# Patient Record
Sex: Female | Born: 1965 | Race: White | Hispanic: No | Marital: Married | Smoking: Former smoker
Health system: Southern US, Community
[De-identification: ages and names within clinical notes are randomized; demographics above are authoritative.]

## PROBLEM LIST (undated history)

## (undated) DIAGNOSIS — Z87891 Personal history of nicotine dependence: Secondary | ICD-10-CM

## (undated) HISTORY — DX: Personal history of nicotine dependence: Z87.891

---

## 2000-05-21 ENCOUNTER — Ambulatory Visit (HOSPITAL_COMMUNITY): Admission: RE | Admit: 2000-05-21 | Discharge: 2000-05-21 | Payer: Self-pay | Admitting: Obstetrics and Gynecology

## 2000-06-16 ENCOUNTER — Other Ambulatory Visit: Admission: RE | Admit: 2000-06-16 | Discharge: 2000-06-16 | Payer: Self-pay | Admitting: Obstetrics and Gynecology

## 2001-01-03 ENCOUNTER — Inpatient Hospital Stay (HOSPITAL_COMMUNITY): Admission: AD | Admit: 2001-01-03 | Discharge: 2001-01-06 | Payer: Self-pay | Admitting: Obstetrics and Gynecology

## 2001-01-07 ENCOUNTER — Encounter: Admission: RE | Admit: 2001-01-07 | Discharge: 2001-02-06 | Payer: Self-pay | Admitting: Obstetrics and Gynecology

## 2001-06-17 ENCOUNTER — Other Ambulatory Visit: Admission: RE | Admit: 2001-06-17 | Discharge: 2001-06-17 | Payer: Self-pay | Admitting: Obstetrics and Gynecology

## 2004-08-07 ENCOUNTER — Other Ambulatory Visit: Admission: RE | Admit: 2004-08-07 | Discharge: 2004-08-07 | Payer: Self-pay | Admitting: Obstetrics and Gynecology

## 2005-02-12 ENCOUNTER — Inpatient Hospital Stay (HOSPITAL_COMMUNITY): Admission: AD | Admit: 2005-02-12 | Discharge: 2005-02-16 | Payer: Self-pay | Admitting: Obstetrics and Gynecology

## 2005-02-13 ENCOUNTER — Encounter (INDEPENDENT_AMBULATORY_CARE_PROVIDER_SITE_OTHER): Payer: Self-pay | Admitting: *Deleted

## 2005-04-25 ENCOUNTER — Observation Stay (HOSPITAL_COMMUNITY): Admission: AD | Admit: 2005-04-25 | Discharge: 2005-04-26 | Payer: Self-pay | Admitting: Obstetrics and Gynecology

## 2005-10-03 ENCOUNTER — Inpatient Hospital Stay (HOSPITAL_COMMUNITY): Admission: AD | Admit: 2005-10-03 | Discharge: 2005-10-06 | Payer: Self-pay | Admitting: Obstetrics and Gynecology

## 2005-11-15 ENCOUNTER — Other Ambulatory Visit: Admission: RE | Admit: 2005-11-15 | Discharge: 2005-11-15 | Payer: Self-pay | Admitting: Obstetrics and Gynecology

## 2009-05-13 HISTORY — PX: CHOLECYSTECTOMY: SHX55

## 2009-05-19 ENCOUNTER — Ambulatory Visit: Payer: Self-pay | Admitting: Gastroenterology

## 2009-05-19 DIAGNOSIS — K8020 Calculus of gallbladder without cholecystitis without obstruction: Secondary | ICD-10-CM | POA: Insufficient documentation

## 2009-05-22 LAB — CONVERTED CEMR LAB
ALT: 385 units/L — ABNORMAL HIGH (ref 0–35)
AST: 110 units/L — ABNORMAL HIGH (ref 0–37)
Albumin: 3.8 g/dL (ref 3.5–5.2)
Alkaline Phosphatase: 151 units/L — ABNORMAL HIGH (ref 39–117)
Amylase: 35 units/L (ref 27–131)
Bilirubin, Direct: 0.4 mg/dL — ABNORMAL HIGH (ref 0.0–0.3)
Lipase: 20 units/L (ref 11.0–59.0)
Total Bilirubin: 1.3 mg/dL — ABNORMAL HIGH (ref 0.3–1.2)
Total Protein: 7.3 g/dL (ref 6.0–8.3)

## 2009-05-23 ENCOUNTER — Ambulatory Visit (HOSPITAL_COMMUNITY): Admission: RE | Admit: 2009-05-23 | Discharge: 2009-05-23 | Payer: Self-pay | Admitting: Gastroenterology

## 2009-05-23 ENCOUNTER — Telehealth: Payer: Self-pay | Admitting: Gastroenterology

## 2009-05-24 ENCOUNTER — Ambulatory Visit (HOSPITAL_COMMUNITY): Admission: RE | Admit: 2009-05-24 | Discharge: 2009-05-24 | Payer: Self-pay | Admitting: Gastroenterology

## 2009-05-24 ENCOUNTER — Ambulatory Visit: Payer: Self-pay | Admitting: Gastroenterology

## 2009-05-25 ENCOUNTER — Inpatient Hospital Stay (HOSPITAL_COMMUNITY): Admission: AD | Admit: 2009-05-25 | Discharge: 2009-05-28 | Payer: Self-pay | Admitting: Surgery

## 2009-05-25 ENCOUNTER — Encounter (INDEPENDENT_AMBULATORY_CARE_PROVIDER_SITE_OTHER): Payer: Self-pay | Admitting: Surgery

## 2009-05-25 ENCOUNTER — Encounter: Payer: Self-pay | Admitting: Internal Medicine

## 2009-06-14 ENCOUNTER — Encounter: Payer: Self-pay | Admitting: Internal Medicine

## 2009-06-23 ENCOUNTER — Ambulatory Visit: Payer: Self-pay | Admitting: Internal Medicine

## 2009-06-23 DIAGNOSIS — Z87891 Personal history of nicotine dependence: Secondary | ICD-10-CM | POA: Insufficient documentation

## 2009-07-25 LAB — HM MAMMOGRAPHY

## 2009-09-26 ENCOUNTER — Ambulatory Visit: Payer: Self-pay | Admitting: Internal Medicine

## 2009-09-26 DIAGNOSIS — R5383 Other fatigue: Secondary | ICD-10-CM

## 2009-09-26 DIAGNOSIS — R5381 Other malaise: Secondary | ICD-10-CM

## 2009-09-27 LAB — CONVERTED CEMR LAB
ALT: 19 units/L (ref 0–35)
AST: 19 units/L (ref 0–37)
Albumin: 4.2 g/dL (ref 3.5–5.2)
Alkaline Phosphatase: 67 units/L (ref 39–117)
BUN: 13 mg/dL (ref 6–23)
Basophils Absolute: 0 10*3/uL (ref 0.0–0.1)
Basophils Relative: 0.5 % (ref 0.0–3.0)
Bilirubin, Direct: 0.1 mg/dL (ref 0.0–0.3)
CO2: 29 meq/L (ref 19–32)
Calcium: 9.3 mg/dL (ref 8.4–10.5)
Chloride: 101 meq/L (ref 96–112)
Creatinine, Ser: 0.8 mg/dL (ref 0.4–1.2)
Eosinophils Absolute: 0.2 10*3/uL (ref 0.0–0.7)
Eosinophils Relative: 2 % (ref 0.0–5.0)
GFR calc non Af Amer: 79.6 mL/min (ref >60)
Glucose, Bld: 78 mg/dL (ref 70–99)
HCT: 40.7 % (ref 36.0–46.0)
Hemoglobin: 13.9 g/dL (ref 12.0–15.0)
Lymphocytes Relative: 35 % (ref 12.0–46.0)
Lymphs Abs: 2.7 10*3/uL (ref 0.7–4.0)
MCHC: 34.2 g/dL (ref 30.0–36.0)
MCV: 87.7 fL (ref 78.0–100.0)
Monocytes Absolute: 0.5 10*3/uL (ref 0.1–1.0)
Monocytes Relative: 6.4 % (ref 3.0–12.0)
Neutro Abs: 4.3 10*3/uL (ref 1.4–7.7)
Neutrophils Relative %: 56.1 % (ref 43.0–77.0)
Platelets: 241 10*3/uL (ref 150.0–400.0)
Potassium: 4.6 meq/L (ref 3.5–5.1)
RBC: 4.64 M/uL (ref 3.87–5.11)
RDW: 13 % (ref 11.5–14.6)
Sodium: 139 meq/L (ref 135–145)
TSH: 3.02 microintl units/mL (ref 0.35–5.50)
Total Bilirubin: 0.4 mg/dL (ref 0.3–1.2)
Total Protein: 7.3 g/dL (ref 6.0–8.3)
WBC: 7.6 10*3/uL (ref 4.5–10.5)

## 2010-06-10 LAB — CONVERTED CEMR LAB
ALT: 18 units/L (ref 0–35)
AST: 18 units/L (ref 0–37)
Albumin: 4.1 g/dL (ref 3.5–5.2)
Alkaline Phosphatase: 71 units/L (ref 39–117)
BUN: 13 mg/dL (ref 6–23)
Basophils Absolute: 0.1 10*3/uL (ref 0.0–0.1)
Basophils Relative: 2.7 % (ref 0.0–3.0)
Bilirubin Urine: NEGATIVE
Bilirubin, Direct: 0.1 mg/dL (ref 0.0–0.3)
CO2: 29 meq/L (ref 19–32)
Calcium: 9 mg/dL (ref 8.4–10.5)
Chloride: 102 meq/L (ref 96–112)
Cholesterol: 196 mg/dL (ref 0–200)
Creatinine, Ser: 0.8 mg/dL (ref 0.4–1.2)
Eosinophils Absolute: 0.2 10*3/uL (ref 0.0–0.7)
Eosinophils Relative: 4 % (ref 0.0–5.0)
GFR calc non Af Amer: 83.16 mL/min (ref >60)
Glucose, Bld: 89 mg/dL (ref 70–99)
HCT: 39.7 % (ref 36.0–46.0)
HDL: 47 mg/dL (ref >39.00)
Hemoglobin, Urine: NEGATIVE
Hemoglobin: 13.1 g/dL (ref 12.0–15.0)
Ketones, ur: NEGATIVE mg/dL
LDL Cholesterol: 114 mg/dL — ABNORMAL HIGH (ref 0–99)
Leukocytes, UA: NEGATIVE
Lymphocytes Relative: 37.4 % (ref 12.0–46.0)
Lymphs Abs: 2 10*3/uL (ref 0.7–4.0)
MCHC: 33 g/dL (ref 30.0–36.0)
MCV: 87.2 fL (ref 78.0–100.0)
Monocytes Absolute: 0.3 10*3/uL (ref 0.1–1.0)
Monocytes Relative: 5.4 % (ref 3.0–12.0)
Neutro Abs: 2.7 10*3/uL (ref 1.4–7.7)
Neutrophils Relative %: 50.5 % (ref 43.0–77.0)
Nitrite: NEGATIVE
Pap Smear: NEGATIVE
Platelets: 202 10*3/uL (ref 150.0–400.0)
Potassium: 3.8 meq/L (ref 3.5–5.1)
RBC: 4.55 M/uL (ref 3.87–5.11)
RDW: 12.9 % (ref 11.5–14.6)
Sodium: 138 meq/L (ref 135–145)
Specific Gravity, Urine: 1.025 (ref 1.000–1.030)
TSH: 3.07 microintl units/mL (ref 0.35–5.50)
Total Bilirubin: 0.4 mg/dL (ref 0.3–1.2)
Total CHOL/HDL Ratio: 4
Total Protein, Urine: NEGATIVE mg/dL
Total Protein: 6.9 g/dL (ref 6.0–8.3)
Triglycerides: 177 mg/dL — ABNORMAL HIGH (ref 0.0–149.0)
Urine Glucose: NEGATIVE mg/dL
Urobilinogen, UA: 0.2 (ref 0.0–1.0)
VLDL: 35.4 mg/dL (ref 0.0–40.0)
Vit D, 25-Hydroxy: 25 ng/mL — ABNORMAL LOW (ref 30–89)
WBC: 5.3 10*3/uL (ref 4.5–10.5)
pH: 5 (ref 5.0–8.0)

## 2010-06-12 NOTE — Progress Notes (Signed)
Summary: ERCP Scheduled  Phone Note Outgoing Call   Call placed by: Laureen Ochs LPN,  May 23, 2009 2:23 PM Call placed to: Patient Summary of Call: Pt. is scheduled for an ERCP at [facility] on 05-24-09 at 2pm. She will be admitted afterwards for a Lap.Choley by Dr.Newman on 05-25-09. All instructions reviewed w/pt. by phone. Pt. instructed to call back as needed.  Initial call taken by: Laureen Ochs LPN,  May 23, 2009 2:24 PM

## 2010-06-12 NOTE — Procedures (Signed)
Summary: ERCP  Patient: Jasmine Burgess Note: All result statuses are Final unless otherwise noted.  Tests: (1) ERCP (ERC)   ERC ERCP                  DONE     [facility]     [address] [place], [place]  [address]           ERCP PROCEDURE REPORT           PATIENT:  Jasmine Burgess, Jasmine Burgess  MR#:  [id number]     BIRTHDATE:  [birth year]  GENDER:  female           ENDOSCOPIST:  Barbette Hair. Arlyce Dice, MD     ASSISTANT:           PROCEDURE DATE:  05/24/2009     PROCEDURE:  ERCP           INDICATIONS:           MEDICATIONS:   Fentanyl 100 mcg, Versed 8 mg IV, Benadryl 50 mg     IV, glycopyrrolate (Robinal) 0.2 mg     TOPICAL ANESTHETIC:  Cetacaine Spray           DESCRIPTION OF PROCEDURE:   After the risks benefits and     alternatives of the procedure were thoroughly explained, informed     consent was obtained.  The XB-[id number] ([id number]) endoscope was     introduced through the mouth and advanced to the third portion of     the duodenum.           Cannulation of the common bile duct was accomplished. The common     bile duct and intrahepatics were normal without filling defects,     strictures, or stones. CBD, CHD and intrahepatic ducts were     selectively filled with contrast after placement of a 0.19mm wire.     Duct was swept with a 12mm balloon stone extractor. No stones were     seen    The scope was then completely withdrawn from the patient     and the procedure terminated.           COMPLICATIONS:  None           ENDOSCOPIC IMPRESSION:Normal cholangiogram           Pt likely passed bile duct stones           RECOMMENDATIONS:     1) surgery           ______________________________     Barbette Hair. Arlyce Dice, MD           cc: Dr. Rene Paci     cc: Dr. Ovidio Kin           CC:           n.     eSIGNED:   Barbette Hair. Sena Clouatre at 05/24/2009 02:54 PM           Janina Mayo, [phone]  Note: An exclamation mark (!) indicates a result that was not dispersed into the  flowsheet. Document Creation Date: 05/24/2009 2:55 PM _______________________________________________________________________  (1) Order result status: Final Collection or observation date-time: 05/24/2009 14:47 Requested date-time:  Receipt date-time:  Reported date-time:  Referring Physician:   Ordering Physician: Melvia Heaps [phone]) Specimen Source:  Source: Launa Grill Order Number: [id number] Lab site:

## 2010-06-12 NOTE — Assessment & Plan Note (Signed)
Summary: NEW CPX/ [employer] / #/ CD   Vital Signs:  Patient profile:   45 year old female Height:      68.5 inches (173.99 cm) Weight:      197.0 pounds (89.55 kg) O2 Sat:      97 % on Room air Temp:     98.2 degrees F (36.78 degrees C) oral Pulse rate:   71 / minute BP sitting:   118 / 80  (left arm) Cuff size:   large  Vitals Entered By: Orlan Leavens (June 23, 2009 2:25 PM)  O2 Flow:  Room air CC: New patient cpx Is Patient Diabetic? No Pain Assessment Patient in pain? no         Primary Care Provider:  Rene Paci, MD  CC:  New patient cpx.  History of Present Illness: new pt to me and out divison - here to est care also patient is here today for annual physical. Patient feels well and has no complaints.   Preventive Screening-Counseling & Management  Alcohol-Tobacco     Alcohol drinks/day: <1     Alcohol Counseling: not indicated; use of alcohol is not excessive or problematic     Smoking Status: quit     Tobacco Counseling: not indicated; no tobacco use  Caffeine-Diet-Exercise     Does Patient Exercise: yes     Exercise Counseling: to improve exercise regimen     Depression Counseling: not indicated; screening negative for depression  Safety-Violence-Falls     Seat Belt Use: yes     Seat Belt Counseling: not applicable     Helmet Use: yes     Helmet Counseling: not indicated; patient wears helmet when riding bicycle/motocycle     Smoke Detectors: yes     Violence in the Home: no risk noted  Clinical Review Panels:  Prevention   Last Pap Smear:  Interpretation/Result:Negative for intraepithelial Lesion or Malignancy.    (05/14/2007)  Immunizations   Last Tetanus Booster:  Tdap (06/23/2009)   Last Flu Vaccine:  Historical (05/27/2009)   Current Medications (verified): 1)  Advil Pm 200-38 Mg Tabs (Ibuprofen-Diphenhydramine Cit) .... At Bedtime For Sleep 2)  Claritin 10 Mg Tabs (Loratadine) .... Use As Needed 3)  Multivitamins  Tabs (Multiple  Vitamin) .... 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 I have reviewed all other systems and they were negative.   Physical Exam  General:  alert, well-developed, well-nourished, and cooperative to examination.    Eyes:  vision grossly intact; pupils equal, round and reactive to light.  conjunctiva and lids normal.    Ears:  normal pinnae bilaterally, without erythema, swelling, or tenderness to palpation. TMs clear, without effusion, or cerumen impaction. Hearing grossly normal bilaterally  Mouth:  teeth and gums in good repair; mucous membranes moist, without  lesions or ulcers. oropharynx clear without exudate, no erythema.  Neck:  supple, full ROM, no masses, no thyromegaly; no thyroid nodules or tenderness. no JVD or carotid bruits.   Lungs:  normal respiratory effort, no intercostal retractions or use of accessory muscles; normal breath sounds bilaterally - no crackles and no wheezes.    Heart:  normal rate, regular rhythm, no murmur, and no rub. BLE without edema. normal DP pulses and normal cap refill in all 4 extremities    Abdomen:  soft, non-tender, normal bowel sounds, no distention; no masses and no appreciable hepatomegaly or splenomegaly.  well healed scar at umbilicus Genitalia:  defer to gyn Msk:  No deformity or scoliosis  noted of thoracic or lumbar spine.   Neurologic:  alert & oriented X3 and cranial nerves II-XII symetrically intact.  strength normal in all extremities, sensation intact to light touch, and gait normal. speech fluent without dysarthria or aphasia; follows commands with good comprehension.  Skin:  no rashes, vesicles, ulcers, or erythema. No nodules or irregularity to palpation.  Psych:  Oriented X3, memory intact for recent and remote, normally interactive, good eye contact, not anxious appearing, not depressed appearing, and not agitated.      Impression & Recommendations:  Problem # 1:  PREVENTIVE HEALTH CARE (ICD-V70.0) Patient has been counseled on age-appropriate routine health concerns for screening and prevention. These are reviewed and up-to-date. Immunizations are up-to-date or declined. Labs ordered and ECG reviewed.  Orders: EKG w/ Interpretation (93000) TLB-Lipid Panel (80061-LIPID) TLB-BMP (Basic Metabolic Panel-BMET) (80048-METABOL) TLB-CBC Platelet - w/Differential (85025-CBCD) TLB-Hepatic/Liver Function Pnl (80076-HEPATIC) TLB-TSH (Thyroid Stimulating Hormone) (84443-TSH) T-Vitamin D (25-Hydroxy) (13086-57846) TLB-Udip ONLY (81003-UDIP) Misc. Referral (Misc. Ref)  Complete Medication List: 1)  Advil Pm 200-38 Mg Tabs (Ibuprofen-diphenhydramine cit) .... At bedtime for sleep 2)  Claritin 10 Mg Tabs (Loratadine) .... Use as needed 3)  Multivitamins Tabs (Multiple vitamin) .... Once daily  Other Orders: Tdap => 57yrs IM (96295) Admin 1st Vaccine (28413)  Patient Instructions: 1)  it was good to see you today.  2)  test(s) ordered today - your results will be posted on the phone tree for review in 48-72 hours from the time of test completion; call [phone] and enter your 9 digit MRN (listed above on this page, just below your name); if any changes need to be made or there are abnormal results, you will be contacted directly.  3)  exam and EKG look good today! 4)   we'll make referral for mammogram. Our office will contact you regarding this appointment once made.  5)  Take calcium +Vitamin D daily. 6)  Please schedule a follow-up appointment annually for medical physical, sooner if problems.    Immunization History:  Influenza Immunization History:    Influenza:  historical (05/27/2009)  Immunizations Administered:  Tetanus Vaccine:    Vaccine Type: Tdap    Site: left deltoid    Mfr: GlaxoSmithKline    Dose: 0.5 ml    Route: IM    Given by: Orlan Leavens    Exp. Date: 07/08/2011    Lot #: [id number]    VIS given: 06/23/09    Pap Smear  Procedure date:  05/14/2007  Findings:      Interpretation/Result:Negative for intraepithelial Lesion or Malignancy.

## 2010-06-12 NOTE — Letter (Signed)
Summary: Generic Letter  [facility]  [address] [place], [place] [address]   Phone: [phone]  Fax: [phone]    05/19/2009  Jasmine Burgess [address] [place], [place]  [address]  Dear Ms. Fitzmaurice,  It is my pleasure to have treated you recently as a new patient in my office. I appreciate your confidence and the opportunity to participate in your care.  Since I do have a busy inpatient endoscopy schedule and office schedule, my office hours vary weekly. I am, however, available for emergency calls everyday through my office. If I am not available for an urgent office appointment, another one of our gastroenterologist will be able to assist you.  My well-trained staff are prepared to help you at all times. For emergencies after office hours, a physician from our Gastroenterology section is always available through my 24 hour answering service  Once again I welcome you as a new patient and I look forward to a happy and healthy relationship               Sincerely,   Melvia Heaps MD  Appended Document: Generic Letter mailed

## 2010-06-12 NOTE — Assessment & Plan Note (Signed)
Summary: abdominal pain/gallstones/lk   History of Present Illness Visit Type: Initial Visit Primary GI MD: Melvia Heaps MD [facility] Primary Provider: Rene Paci, MD Chief Complaint: abdominal pain History of Present Illness:   Jasmine Burgess is a pleasant 45 year old white female referred at the request of Dr.Leschber for evaluation of abdominal pain.  In 2006, while [redacted] weeks pregnant, she developed severe right upper quadrant pain requiring hospitalization.  Gallstones were seen on ultrasound.  Surgery was recommended following the end of her pregnancy but this was not done.  She subsequently has had very mild episodes of abdominal pain.  Over the past week she has had 3 discrete episodes of severe midepigastric pain with radiation to her back accompanied  by vomiting.  She has had no fever or chills.  She has noted darkening of her urine and questionable yellow sclera.  She currently is pain-free.   GI Review of Systems    Reports abdominal pain, bloating, nausea, and  vomiting.     Location of  Abdominal pain: RUQ.    Denies acid reflux, belching, chest pain, dysphagia with liquids, dysphagia with solids, heartburn, loss of appetite, vomiting blood, weight loss, and  weight gain.      Reports change in bowel habits.     Denies anal fissure, black tarry stools, constipation, diarrhea, diverticulosis, fecal incontinence, heme positive stool, hemorrhoids, irritable bowel syndrome, jaundice, light color stool, liver problems, rectal bleeding, and  rectal pain. Preventive Screening-Counseling & Management  Alcohol-Tobacco     Smoking Status: quit      Drug Use:  no.      Current Medications (verified): 1)  Advil Pm 200-38 Mg Tabs (Ibuprofen-Diphenhydramine Cit) .... 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  Alcohol Use - yes Daily Caffeine Use Illicit Drug Use - no Smoking Status:  quit Drug Use:  no  Review of Systems       The patient complains of allergy/sinus, back pain, and cough.         All other systems were reviewed and were negative   Vital Signs:  Patient profile:   45 year old female Height:      68.5 inches Weight:      205 pounds BMI:     30.83 Pulse rate:   60 / minute Pulse rhythm:   regular BP sitting:   106 / 70  (left arm) Cuff size:   regular  Vitals Entered By: June McMurray CMA Duncan Dull) (May 19, 2009 3:41 PM)  Physical Exam  Additional Exam:  She is a healthy-appearing female  skin: anicteric HEENT: normocephalic; PEERLA; no nasal or pharyngeal abnormalities neck: supple nodes: no cervical lymphadenopathy chest: clear to ausculatation and percussion heart: no murmurs, gallops, or rubs abd: soft, nontender; BS normoactive; no abdominal masses,  organomegaly, there is mild tenderness to palpation in the right upper quadrant rectal: deferred ext: no cynanosis, clubbing, edema skeletal: no deformities neuro: oriented x 3; no focal abnormalities    Impression & Recommendations:  Problem # 1:  CHOLELITHIASIS, SYMPTOMATIC (ICD-574.20)  The patient's symptoms are very likely biliary in origin.  Questionable jaundice and dark urine raises the question of bile duct obstruction from choledocholithiasis.  Recommendations #1check  LFTs and amylase #2 abdominal ultrasound #3 surgical referral  Orders: TLB-Hepatic/Liver Function Pnl (  80076-HEPATIC) TLB-Amylase (82150-AMYL) TLB-Lipase (83690-LIPASE) Prescriptions: PERCOCET 5-325 MG TABS (OXYCODONE-ACETAMINOPHEN) take 1-2 tabs q.6 h. p.r.n.  #20 x 0   Entered and Authorized by:   Louis Meckel MD   Signed by:   Louis Meckel MD on 05/19/2009   Method used:   Print then Give to Patient   RxID:   [id number]   Appended  Document: Orders Update    Clinical Lists Changes  Orders: Added new Test order of Ultrasound Abdomen (UAS) - Signed      Appended Document: Orders Update    Clinical Lists Changes  Orders: Added new Test order of [facility] ([facility]) - Signed

## 2010-06-12 NOTE — Letter (Signed)
Summary: [facility]   Imported By: Lester [place] 06/22/2009 08:02:33  _____________________________________________________________________  External Attachment:    Type:   Image     Comment:   External Document

## 2010-06-12 NOTE — Consult Note (Signed)
Summary: [facility] Consult   NAME:  Jasmine Burgess, Jasmine Burgess                  ACCOUNT NO.:  [id number]   MEDICAL RECORD NO.:  [id number]          PATIENT TYPE:  INP   LOCATION:  9310                          FACILITY:  [facility]   PHYSICIAN:  Angelia Mould. Derrell Lolling, M.D.DATE OF BIRTH:  [birth year]   DATE OF CONSULTATION:  02/13/2005  DATE OF DISCHARGE:                                   CONSULTATION   REASON FOR CONSULTATION:  Evaluate abdominal pain and gallstones.   HISTORY OF PRESENT ILLNESS:  This is a 45 year old white female who is [redacted]  weeks pregnant with her second pregnancy.  She presented with a 24 hour  history of epigastric and right upper quadrant pain and back pain and nausea  and vomiting.  The nausea and vomiting have resolved but she still has pain.  She was seen by Dr Dierdre Forth and was admitted on the evening of  February 12, 2005, and we were called to see her on February 13, 2005.  An  ultrasound has been performed.  This shows multiple gallstones, but the  gallbladder wall is not thickened.  There is no  pericholecystic fluid, and  the common bile duct is not dilated.   PAST MEDICAL HISTORY:  She has been healthy.  No medical or surgical  problems.   CURRENT MEDICATIONS:  None.   ALLERGIES:  None known.   SOCIAL HISTORY:  She is married, self employed, denies use of tobacco,  alcohol or street drugs.   FAMILY HISTORY:  Her mother and two sisters have had cholecystectomy.  Otherwise, there is no significant familial diseases.   REVIEW OF SYSTEMS:  A 15-system review of systems is performed.  It is  noncontributory, except as described above.   PHYSICAL EXAMINATION:  GENERAL APPEARANCE:  Pleasant, alert, white female in  minimal distress.  VITAL SIGNS:  Temperature 98.3, heart rate 57-74, blood pressure 111/69,  respirations 18 and unlabored.  Oxygen saturation 98%.  HEENT:  EYES:  Sclerae are clear.  Extraocular movements intact.  ENT:  Nose, lips, tongue and oropharynx without  __________.  NECK:  Supple, nontender, no mass.  No JVD.  LUNGS:  Clear to auscultation.  No chest wall tenderness.  HEART:  Regular rate and rhythm.  No murmur.  Rate on femoral and posterior  tibial pulses are palpable.  ABDOMEN:  Soft, nondistended.  Liver and spleen are not enlarged.  She  subjectively is tender in the epigastrium and right upper quadrant, but  there is no guarding, no mass, no rebound, no extension.  EXTREMITIES:  She moves all four extremities well without pain or deformity.  NEUROLOGICAL:  No gross motor or sensory deficits.   LABORATORY DATA:  Amylase 56, lipase 39, pregnancy test positive.  Comprehensive metabolic panel is normal except for glucose of 153.  CBC  shows white count of 13,600 and hemoglobin of 12.9.   ASSESSMENT:  1.  Gallstones with moderately severe biliary colic.  2.  Question of whether she may have a component of acute cholecystitis.  3.  Leukocytosis.  It is not clear whether  this is due to cholecystitis or      to the pregnancy.  4.  Eight weeks intrauterine pregnancy.   PLAN:  For the next 24 hours, I would keep her n.p.o. and keep her hydrated  without any fluids.  Analgesics and antiemetics have already been ordered.  I will add IV Ancef in case there is any component of bacterial invasion of  the gallbladder.  I am not sure this is the case, however.   We will try to avoid cholecystectomy during her pregnancy, and at the very  least, will try to avoid it during the first trimester.   We will follow along with you.      Angelia Mould. Derrell Lolling, M.D.  Electronically Signed     HMI/MEDQ  D:  02/14/2005  T:  02/14/2005  Job:  [id number]

## 2010-06-12 NOTE — Consult Note (Signed)
[facility]   Imported By: Lester McIntosh 06/10/2009 09:16:40  _____________________________________________________________________  External Attachment:    Type:   Image     Comment:   External Document

## 2010-06-12 NOTE — Assessment & Plan Note (Signed)
Summary: fatigue,no energy/cd   Vital Signs:  Patient profile:   45 year old female Height:      68.5 inches (173.99 cm) Weight:      204 pounds (92.73 kg) O2 Sat:      98 % on Room air Temp:     98.1 degrees F (36.72 degrees C) oral Pulse rate:   58 / minute BP sitting:   102 / 78  (left arm) Cuff size:   large  Vitals Entered By: Orlan Leavens (Sep 26, 2009 11:19 AM)  O2 Flow:  Room air CC: Fatigue/ No energy Is Patient Diabetic? No Pain Assessment Patient in pain? no        Primary Care Provider:  Rene Paci, MD  CC:  Fatigue/ No energy.  History of Present Illness:  Fatigue      This is a 45 year old woman who presents with Fatigue.  The symptoms began 4 weeks ago.  The severity is described as moderate.  occassional dizziness. +tick bite and exposure 08/2009 while camping but no rash.  The patient reports persistent fatigue and fatigue without physical limitations.  The patient denies fever, night sweats, weight loss, exertional chest pain, dyspnea, cough, hemoptysis, and new medications.  Other symptoms include daytime sleepiness.  The patient denies the following symptoms: leg swelling, orthopnea, melena, and skin changes.  Depressive symptoms include anhedonia.  The patient denies feeling depressed, altered appetite, and poor sleep.    Current Medications (verified): 1)  Advil Pm 200-38 Mg Tabs (Ibuprofen-Diphenhydramine Cit) .... At Bedtime For Sleep 2)  Claritin 10 Mg Tabs (Loratadine) .... Use As Needed 3)  Multivitamins  Tabs (Multiple Vitamin) .... Once Daily  Allergies (verified): No Known Drug Allergies  Past History:  Past Medical History: cholelithiasis hx overweight  MD rooster: GI - [facility] surg - [facility] - [place]  Family History: Reviewed history from 06/23/2009 and no changes required. No FH of Colon Cancer: mom - 73y - DM dx age 82, HTN, obese dad - 92s - A flutter s/p ablation  Social History: Reviewed history from 06/23/2009  and no changes required. 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    Physical Exam  General:  alert, well-developed, well-nourished, and cooperative to examination.    Eyes:  vision grossly intact; pupils equal, round and reactive to light.  conjunctiva and lids normal.    Ears:  normal pinnae bilaterally, without erythema, swelling, or tenderness to palpation. TMs clear, without effusion, or cerumen impaction. Hearing grossly normal bilaterally  Mouth:  teeth and gums in good repair; mucous membranes moist, without lesions or ulcers. oropharynx clear without exudate, no erythema.  Lungs:  normal respiratory effort, no intercostal retractions or use of accessory muscles; normal breath sounds bilaterally - no crackles and no wheezes.    Heart:  normal rate, regular rhythm, no murmur, and no rub. BLE without edema. Skin:  2 healing areas of insect bite - left lateral breast and left scapula - no cellulitis - no rashes, vesicles, ulcers, or erythema. No nodules or irregularity to palpation.  Psych:  Oriented X3, memory intact for recent and remote, normally interactive, good eye contact, not anxious  appearing, not depressed appearing, and not agitated.      Impression & Recommendations:  Problem # 1:  FATIGUE (ICD-780.79) hx and exam benign - no constellation of symptoms for dx of lyme or RMSF at this time - check labs now - no indicaton fo abx or other specific tx at this time consider tx for mild depression if labs unremarkable - pt to consider Orders: TLB-CBC Platelet - w/Differential (85025-CBCD) TLB-BMP (Basic Metabolic Panel-BMET)  (80048-METABOL) TLB-Hepatic/Liver Function Pnl (80076-HEPATIC) TLB-TSH (Thyroid Stimulating Hormone) (84443-TSH)  Complete Medication List: 1)  Advil Pm 200-38 Mg Tabs (Ibuprofen-diphenhydramine cit) .... At bedtime for sleep 2)  Claritin 10 Mg Tabs (Loratadine) .... Use as needed 3)  Multivitamins Tabs (Multiple vitamin) .... Once daily  Patient Instructions: 1)  it was good to see you today. 2)  exam looks normal today - try using 1% hydrocortisone cream to itch/bite as needed  3)  test(s) ordered today - your results will be posted on the phone tree for review in 48-72 hours from the time of test completion; call [phone] and enter your 9 digit MRN (listed above on this page, just below your name); if any changes need to be made or there are abnormal results, you will be contacted directly.  4)  if labs normal and persisitng fatigue, consider treatment for dysthmia/depression - recommend cymbalta or citalopram - may call and let us know - 5)  plan a follow-up appointment in 6 weeks to review symptoms, call sooner if problems.

## 2010-07-29 LAB — DIFFERENTIAL
Basophils Absolute: 0 10*3/uL (ref 0.0–0.1)
Basophils Absolute: 0 10*3/uL (ref 0.0–0.1)
Basophils Relative: 0 % (ref 0–1)
Basophils Relative: 0 % (ref 0–1)
Eosinophils Absolute: 0 10*3/uL (ref 0.0–0.7)
Eosinophils Absolute: 0.1 10*3/uL (ref 0.0–0.7)
Eosinophils Relative: 0 % (ref 0–5)
Eosinophils Relative: 1 % (ref 0–5)
Lymphocytes Relative: 16 % (ref 12–46)
Lymphocytes Relative: 36 % (ref 12–46)
Lymphs Abs: 1.6 10*3/uL (ref 0.7–4.0)
Lymphs Abs: 2.3 10*3/uL (ref 0.7–4.0)
Monocytes Absolute: 0.5 10*3/uL (ref 0.1–1.0)
Monocytes Absolute: 0.8 10*3/uL (ref 0.1–1.0)
Monocytes Relative: 8 % (ref 3–12)
Monocytes Relative: 8 % (ref 3–12)
Neutro Abs: 3.4 10*3/uL (ref 1.7–7.7)
Neutro Abs: 7.9 10*3/uL — ABNORMAL HIGH (ref 1.7–7.7)
Neutrophils Relative %: 54 % (ref 43–77)
Neutrophils Relative %: 76 % (ref 43–77)

## 2010-07-29 LAB — PREGNANCY, URINE: Preg Test, Ur: NEGATIVE

## 2010-07-29 LAB — COMPREHENSIVE METABOLIC PANEL
ALT: 89 U/L — ABNORMAL HIGH (ref 0–35)
AST: 34 U/L (ref 0–37)
Albumin: 3 g/dL — ABNORMAL LOW (ref 3.5–5.2)
Alkaline Phosphatase: 81 U/L (ref 39–117)
BUN: 7 mg/dL (ref 6–23)
CO2: 27 mEq/L (ref 19–32)
Calcium: 8.4 mg/dL (ref 8.4–10.5)
Chloride: 109 mEq/L (ref 96–112)
Creatinine, Ser: 0.99 mg/dL (ref 0.4–1.2)
GFR calc Af Amer: >60 mL/min (ref >60)
GFR calc non Af Amer: >60 mL/min (ref >60)
Glucose, Bld: 139 mg/dL — ABNORMAL HIGH (ref 70–99)
Potassium: 4.2 mEq/L (ref 3.5–5.1)
Sodium: 139 mEq/L (ref 135–145)
Total Bilirubin: 0.8 mg/dL (ref 0.3–1.2)
Total Protein: 5.6 g/dL — ABNORMAL LOW (ref 6.0–8.3)

## 2010-07-29 LAB — CBC
HCT: 32.5 % — ABNORMAL LOW (ref 36.0–46.0)
HCT: 33.2 % — ABNORMAL LOW (ref 36.0–46.0)
Hemoglobin: 10.9 g/dL — ABNORMAL LOW (ref 12.0–15.0)
Hemoglobin: 11.3 g/dL — ABNORMAL LOW (ref 12.0–15.0)
MCHC: 33.4 g/dL (ref 30.0–36.0)
MCHC: 33.9 g/dL (ref 30.0–36.0)
MCV: 87 fL (ref 78.0–100.0)
MCV: 87.5 fL (ref 78.0–100.0)
Platelets: 174 10*3/uL (ref 150–400)
Platelets: 200 10*3/uL (ref 150–400)
RBC: 3.71 MIL/uL — ABNORMAL LOW (ref 3.87–5.11)
RBC: 3.82 MIL/uL — ABNORMAL LOW (ref 3.87–5.11)
RDW: 13.5 % (ref 11.5–15.5)
RDW: 13.5 % (ref 11.5–15.5)
WBC: 10.4 10*3/uL (ref 4.0–10.5)
WBC: 6.3 10*3/uL (ref 4.0–10.5)

## 2010-09-28 NOTE — H&P (Signed)
NAMEMARTHENA, Jasmine Burgess                  ACCOUNT NO.:  [id number]   MEDICAL RECORD NO.:  [id number]          PATIENT TYPE:  MAT   LOCATION:  MATC                          [facility]:  [facility]   PHYSICIAN:  Hal Morales, M.D.DATE OF BIRTH:  [birth year]   DATE OF ADMISSION:  02/12/2005  DATE OF DISCHARGE:                                HISTORY & PHYSICAL   Jasmine Burgess is a 45 year old married white female, gravida 2, para 1-0-0-1,  with LMP of December 15, 2004, estimated gestational age of [redacted] weeks 5 days, who  presents to maternity admissions with complaints of severe right upper  quadrant pain associated with nausea and vomiting.  The patient denies  uterine cramping, bleeding or other GYN complaints.  The patient reports  that the pain started approximately 2 o'clock on February 12, 2005, and has  progressively worsened over the evening.  The patient now with intractable  vomiting.  The patient has tried to treat the vomiting with Phenergan  without success.  The patient report the pain is constant in the right upper  quadrant and epigastric area with radiation through to her back as well.  The patient denies any fever.  The patient reports that she has had  constipation for the past three days and denies diarrhea.  The patient  reports that she did have a small bowel movement today.  The patient rates  the pain as 9/10 on a pain scale.  The patient denies any previous history  of problems except during her previous pregnancy with similar symptoms.  The  patient denies any shortness of breath or chest pain.  The patient also  denies any UTI signs and symptoms.  The patient is to start [facility] care at  [facility] on Monday, February 18, 2005.   OBSTETRIC HISTORY:  The patient is a gravida 2, para 1.  First pregnancy,  spontaneous vaginal delivery of a viable female at term without complications;  however, patient states that she feels she had preeclampsia due to swelling  and small baby, however no  elevated blood pressure.   PAST MEDICAL HISTORY:  Negative.   PAST SURGICAL HISTORY:  Negative.   FAMILY HISTORY:  Mother and father with hypertension as well as gallbladder  disease.  Sister with ovarian cancer.   SOCIAL HISTORY:  The patient is married to Gisela, who is involved and  supportive.  The patient denies use of alcohol, tobacco or street drugs.  She works as a Immunologist.   CURRENT MEDICATIONS:  Phenergan 25 mg one dose only.   No known drug allergies.   REVIEW OF SYSTEMS:  Negative with the exception noted above.   OBJECTIVE:  VITAL SIGNS:  Temperature 97.3, vital signs are stable.  GENERAL:  Skin warm and dry.  Color is satisfactory.  Patient is in obvious  discomfort.  HEENT:  Within normal limits.  CHEST:  Lungs are clear to AP auscultation.  CARDIAC:  Regular rate and rhythm without murmur, rub, or gallop.  BREASTS:  Deferred.  ABDOMEN:  Soft with positive bowel sounds in all  four quadrants.  Moderate  tenderness is noted over the right upper quadrant and epigastric area with  guarding present.  No other rebound or guarding or tenderness is noted  throughout the abdomen.  Negative CVA tenderness present.  EXTREMITIES:  No edema.  Negative Homans bilaterally.  PELVIC:  Speculum exam deferred.  Cervix is closed, thick, and uterus is  approximately eight weeks' size.  Uterus is mobile and nontender.  Adnexa  without masses or tenderness noted.  RECTAL:  No stool is noted in the rectal area.   ADMISSION LABORATORY DATA:  Amylase and lipase within normal limits.  CMET  within normal limits.  CBC:  WBC 13.6, hemoglobin 12.9, hematocrit 38.0,  platelets 277,000.  Ultrasound of the gallbladder reveals cholelithiasis,  multiple stones present but no evidence of cholecystitis or biliary  dilation.  OB ultrasound reveals intrauterine pregnancy consistent with 8  weeks 2 days' gestation, with positive fetal heart tones and a corpus luteum  cyst on the  right ovary, otherwise within normal limits, and yolk sac was  also noted to be visualized.   ASSESSMENT:  1.  Intrauterine pregnancy at 8 weeks.  2.  Right upper quadrant pain.  3.  Cholelithiasis, no current evidence of cholecystitis.  4.  Biliary colic.   PLAN:  Per Dr. Pennie Rushing, the patient will be admitted to women's unit for 23-  hour observation.  General surgery consult will be obtained in order to  further evaluate the patient's gallbladder issues.  The patient will be  maintained on IV fluids and pain medications as needed for symptom control  at the present time.     ______________________________  Rhona Leavens, CNM      Hal Morales, M.D.  Electronically Signed    NOS/MEDQ  D:  02/13/2005  T:  02/13/2005  Job:  [id number]

## 2010-09-28 NOTE — Consult Note (Signed)
NAMEJAMI, Jasmine Burgess                  ACCOUNT NO.:  [id number]   MEDICAL RECORD NO.:  [id number]          PATIENT TYPE:  INP   LOCATION:  9310                          [facility]ACILITY:  [facility]   PHYSICIAN:  Angelia Mould. Derrell Lolling, M.D.DATE OF BIRTH:  [birth year]   DATE OF CONSULTATION:  02/13/2005  DATE OF DISCHARGE:                                   CONSULTATION   REASON FOR CONSULTATION:  Evaluate abdominal pain and gallstones.   HISTORY OF PRESENT ILLNESS:  This is a 45 year old white female who is [redacted]  weeks pregnant with her second pregnancy.  She presented with a 24 hour  history of epigastric and right upper quadrant pain and back pain and nausea  and vomiting.  The nausea and vomiting have resolved but she still has pain.  She was seen by Dr Dierdre Forth and was admitted on the evening of  February 12, 2005, and we were called to see her on February 13, 2005.  An  ultrasound has been performed.  This shows multiple gallstones, but the  gallbladder wall is not thickened.  There is no  pericholecystic fluid, and  the common bile duct is not dilated.   PAST MEDICAL HISTORY:  She has been healthy.  No medical or surgical  problems.   CURRENT MEDICATIONS:  None.   ALLERGIES:  None known.   SOCIAL HISTORY:  She is married, self employed, denies use of tobacco,  alcohol or street drugs.   FAMILY HISTORY:  Her mother and two sisters have had cholecystectomy.  Otherwise, there is no significant familial diseases.   REVIEW OF SYSTEMS:  A 15-system review of systems is performed.  It is  noncontributory, except as described above.   PHYSICAL EXAMINATION:  GENERAL APPEARANCE:  Pleasant, alert, white female in  minimal distress.  VITAL SIGNS:  Temperature 98.3, heart rate 57-74, blood pressure 111/69,  respirations 18 and unlabored.  Oxygen saturation 98%.  HEENT:  EYES:  Sclerae are clear.  Extraocular movements intact.  ENT:  Nose, lips, tongue and oropharynx without __________.  NECK:  Supple,  nontender, no mass.  No JVD.  LUNGS:  Clear to auscultation.  No chest wall tenderness.  HEART:  Regular rate and rhythm.  No murmur.  Rate on femoral and posterior  tibial pulses are palpable.  ABDOMEN:  Soft, nondistended.  Liver and spleen are not enlarged.  She  subjectively is tender in the epigastrium and right upper quadrant, but  there is no guarding, no mass, no rebound, no extension.  EXTREMITIES:  She moves all four extremities well without pain or deformity.  NEUROLOGICAL:  No gross motor or sensory deficits.   LABORATORY DATA:  Amylase 56, lipase 39, pregnancy test positive.  Comprehensive metabolic panel is normal except for glucose of 153.  CBC  shows white count of 13,600 and hemoglobin of 12.9.   ASSESSMENT:  1.  Gallstones with moderately severe biliary colic.  2.  Question of whether she may have a component of acute cholecystitis.  3.  Leukocytosis.  It is not clear whether this is due to cholecystitis  or      to the pregnancy.  4.  Eight weeks intrauterine pregnancy.   PLAN:  For the next 24 hours, I would keep her n.p.o. and keep her hydrated  without any fluids.  Analgesics and antiemetics have already been ordered.  I will add IV Ancef in case there is any component of bacterial invasion of  the gallbladder.  I am not sure this is the case, however.   We will try to avoid cholecystectomy during her pregnancy, and at the very  least, will try to avoid it during the first trimester.   We will follow along with you.      Angelia Mould. Derrell Lolling, M.D.  Electronically Signed     HMI/MEDQ  D:  02/14/2005  T:  02/14/2005  Job:  [id number]

## 2010-09-28 NOTE — Discharge Summary (Signed)
NAMEAVILENE, Jasmine Burgess                  ACCOUNT NO.:  [id number]   MEDICAL RECORD NO.:  [id number]          PATIENT TYPE:  OBV   LOCATION:  9319                          [facility]   PHYSICIAN:  Naima A. Dillard, M.D. DATE OF BIRTH:  [birth year]   DATE OF ADMISSION:  04/25/2005  DATE OF DISCHARGE:  04/26/2005                                 DISCHARGE SUMMARY   ADMISSION DIAGNOSES:  1.  Intrauterine pregnancy at 67 and 5/7 weeks.  2.  Gastroenteritis.  3.  History of cholelithiasis.   DISCHARGE DIAGNOSES:  1.  Intrauterine pregnancy at 95 and 5/7 weeks.  2.  Gastroenteritis.  3.  History of cholelithiasis.   HOSPITAL PROCEDURES:  IV fluid hydration.   HOSPITAL COURSE:  The patient was admitted with nausea, vomiting, and  diarrhea for two days, unable to keep food or fluids down for the last 24  hours.  She does have a known history of gallstones, but was not having any  pain related to that at this time.  She was admitted for IV hydration and  antiemetic therapy.  Blood work on admission was normal.  She did well  overnight and was able to keep down a liquid diet this morning with no  further vomiting.  She also denies nausea.  She does report positive fetal  movement.  Her chest is clear; heart rate, regular rate and rhythm; and  abdomen is soft and non-tender with no guarding or rebound; and she was  deemed to have received the full benefit of her hospital stay and was  discharged home.   DISCHARGE MEDICATIONS:  1.  Phenergan 25 mg p.o. q. 6 h. p.r.n.  2.  Zantac 75 to 150 mg p.o. b.i.d.   DISCHARGE LABORATORY:  Sodium 138, potassium 4, creatinine 0.7, lipase 29,  white blood cell count 9.4, hemoglobin 12.2, platelets 232.   DISCHARGE INSTRUCTIONS:  To include liquids today with progression to food  this evening.  Bed rest as needed.   DISCHARGE FOLLOWUP:  As scheduled or p.r.n.      Marie L. Williams, C.N.M.      Naima A. Normand Sloop, M.D.  Electronically  Signed    MLW/MEDQ  D:  04/26/2005  T:  04/27/2005  Job:  [id number]

## 2010-09-28 NOTE — H&P (Signed)
Jasmine Burgess, Jasmine Burgess                  ACCOUNT NO.:  [id number]   MEDICAL RECORD NO.:  [id number]          PATIENT TYPE:  MAT   LOCATION:  MATC                          [facility]   PHYSICIAN:  Hal Morales, M.D.DATE OF BIRTH:  May 23, 1965   DATE OF ADMISSION:  04/25/2005  DATE OF DISCHARGE:                                HISTORY & PHYSICAL   HISTORY OF PRESENT ILLNESS:  Jasmine Burgess is a 45 year old gravida 3, para 1-0-  1-1, at 18-5/7 weeks who presented to maternity admission unit complaining  of nausea, vomiting, and diarrhea for 2 days.  She has not been able to keep  food and fluids down for the last 24 hours despite Phenergan.  She has no  known viral exposure.  She denies any abdominal pain or cramping and no  epigastric or right upper quadrant pain.  She does report severe reflux over  the last few days.  Pregnancy has been remarkable for:  (1) Known gallstones  which were diagnosed in October of 2006.  (2) Advanced maternal age, had  first trimester screening, but declined amniocentesis.  (3) Reflux.   PRENATAL LABORATORY DATA:  Blood type is A positive, Rh antibody negative,  VDRL nonreactive, rubella titer positive, hepatitis B surface antigen  negative.  HIV and cystic fibrosis testing were declined.  GC and Chlamydia  cultures were negative.  Pap was normal in March of 2006.  Hemoglobin upon  entry into practice was 12.3.  EDC of Sep 20, 2005, was established by last  menstrual period and was in agreement with ultrasound at approximately 8  weeks.   HISTORY OF PRESENT PREGNANCY:  The patient entered care at approximately 10  weeks.  She had an appointment scheduled with Dr. Sherrie Burgess at [facility]  in October for first trimester screening.  This was within normal limits.  She declined amniocentesis.  She was then seen for her physical examination  visit at approximately 11 weeks and was doing well.  The rest of her  pregnancy has been uncomplicated so far.  She was  diagnosed with gallstones  on October 3, when she was admitted to the hospital.  She was in the  hospital from October 3 to October 6.  At that time, she was followed by  Jasmine Burgess. Jasmine Burgess, M.D. for cholelithiasis.  She elected to defer a  cholecystectomy at that time.   PAST OBSTETRICAL HISTORY:  In 2002, she had a vaginal birth of a female  infant, weight 5 pounds 15 ounces at 39 weeks.  She was in labor 10 hours.  She had epidural anesthesia.  In 1992, she had a therapeutic termination at  8 weeks.   PAST MEDICAL HISTORY:  She reports the usual childhood illnesses.  She was  diagnosed with previously noted gallstones on February 12, 2005.  She also  fractured her left wrist in 7th grade.  She was hospitalized for childbirth  x1 and with her cholelithiasis from February 12, 2005, to February 16, 2005.  She denies any medication allergies.   FAMILY HISTORY:  Maternal grandmother had an  MI.  Mother and father have  hypertension.  Her father has a history of varicosities.  Sister has anemia.  Maternal cousin has lupus.  Her sister had ovarian cancer and maternal  grandfather had history of depression.   GENETIC HISTORY:  Remarkable for the patient's advanced maternal age of 52.  She also has a maternal cousin who has cerebral palsy and a maternal cousin  with muscular dystrophy.   SOCIAL HISTORY:  The patient is married to the father of the baby.  He is  involved and supportive.  His name is Anthoney Harada.  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  Her husband is college and graduate educated.  He is an Pensions consultant.  She has been followed by the physician service at  [facility].  She denies any alcohol, drug, or tobacco use during  this pregnancy.   PHYSICAL EXAMINATION:  VITAL SIGNS:  Stable.  The patient is afebrile.  HEENT:  Within normal limits.  LUNGS:  Breath sounds are clear.  HEART:  Regular rate and rhythm without murmur.  BREASTS:  Soft and nontender.   ABDOMEN:  Gravid with a fundal height of approximately 18 cm.  The patient  has nontender abdomen with no rebound or guarding.  Fetal heart tones are  152 per Doppler.  EXTREMITIES:  Deep tendon reflexes are 2+ without clonus.  There is no edema  noted.   LABORATORY DATA:  Urine specific gravity greater than 1.030 and greater than  80 ketones.  CBC shows hemoglobin 12.2, hematocrit 35.8, white blood cell  count 9.4, and platelets 232.  Comprehensive metabolic panel is within  normal limits with an SGOT of 17, SGPT 17, amylase 44, and lipase 2.9.  Total bilirubin is 1.0.   IMPRESSION:  1.  Intrauterine pregnancy at 18-5/7 weeks.  2.  Probable viral syndrome.  3.  Known gallstones.  4.  Reflux.   PLAN:  1.  Admit to [facility] for consult with Hal Morales, M.D. for outpatient observation.  2.  Continue IV fluid hydration.  3.  Protonix 40 mg IV daily.  4.  Continue Phenergan p.r.n.  5.  We will reevaluate in the morning.      Jasmine Burgess, C.N.M.      Hal Morales, M.D.  Electronically Signed    VLL/MEDQ  D:  04/25/2005  T:  04/26/2005  Job:  [id number]

## 2010-09-28 NOTE — H&P (Signed)
[facility]  Patient:    Jasmine Burgess, Jasmine Burgess Visit Number: [id number] MRN: [id number]          Service Type: OBS Location: [id number] Attending Physician:  Michael Litter Dictated by:   Marcelle Smiling Shaw, C.N.M. Adm. Date:  01/03/2001                           History and Physical  DATE OF BIRTH:                August 05, 1965  HISTORY OF PRESENT ILLNESS:   Ms. Bernardy is a 45 year old married white female primigravida at 72 and one-seventh weeks who presents with a gush of clear fluid at 8 a.m.  She reports rare uterine contractions and denies bleeding, headache, nausea and vomiting, or visual disturbances.  She reports positive fetal movement.  Her pregnancy has been followed by the [facility] OB[facility] certified nurse midwife service and has been remarkable for: 1. First trimester spotting. 2. Group B strep negative.  HISTORY OF PRESENT PREGNANCY:                    She presented for care at approximately [redacted] weeks gestation.  Ultrasound at approximately 18 weeks showed growth consistent with previous dating.  She had a three-hour glucose tolerance test as a result of an elevated one-hour Glucola at approximately [redacted] weeks gestation which was normal.  The rest of her prenatal care has been unremarkable.  PRENATAL LABORATORY DATA:     Her prenatal labs were collected on June 16, 2000.  Hemoglobin 13.0; hematocrit 39.0; platelets 255,000.  Blood type A positive, antibody negative.  RPR nonreactive.  Rubella immune.  Hepatitis B surface antigen negative.  Pap smear within normal limits.  On October 16, 2000 her one-hour Glucola was 147.  On November 03, 2000 her three-hour glucose tolerance test:  Fasting blood sugar 78, one hour 114, two hour 114, three hour 99.  On December 10, 2000 culture of the vaginal tract for group B strep was negative.  OBSTETRICAL HISTORY:          She is a gravida 2 para 0-0-1-0.  In 1992 at approximately eight weeks  gestation she had an elective AB with no complications.  ALLERGIES:                    She has no medication allergies.  MEDICAL HISTORY:              She reports having had the usual childhood illnesses.  She has used condoms in the past for contraception.  She has an occasional yeast infection.  She had a fractured left wrist in the seventh grade.  SOCIAL HISTORY:               Significant for an elective AB in 1992.  FAMILY HISTORY:               Maternal grandfather had a myocardial infarction.  Her mother is on medication for hypertension.  Father has a history of varicosities.  Sister with history of anemia.  Sister with a history of asthma.  Maternal cousin with lupus.  Sister with cervical cancer. Maternal grandfather with history of depression.  GENETIC HISTORY:              Significant for patients maternal cousin with cerebral palsy and patients maternal cousin with muscular dystrophy.  SOCIAL HISTORY:               She is married to [place], who is involved and supportive.  They are of the [place] faith.  Both are college educated and employed full time, and they deny any alcohol, smoking, or illicit drug use since her positive pregnancy test.  OBJECTIVE DATA:  VITAL SIGNS:                  Stable.  She is afebrile.  HEENT:                        Grossly within normal limits.  CHEST:                        Clear to auscultation.  HEART:                        Regular to rate and rhythm.  ABDOMEN:                      Gravid in contour with uterine contractions that are irregular and mild.  Fetal heart rate is reactive and reassuring.  PELVIC:                       Cervical exam is 1 cm, 75% effaced, vertex, -2, with copious clear fluid present which is positive nitrazine and positive ferning.  EXTREMITIES:                  Within normal limits.  ASSESSMENT:                   1. Intrauterine pregnancy at term.                               2. Premature  rupture of membranes.                               3. Group B strep negative.  PLAN:                         1. Admit to birthing suite per consult with                                  Dr. Normand Sloop.                               2. Routine CNM orders.                               3. Discussion held with patient and spouse                                  regarding the use of Pitocin to augment                                  labor.  Risks and benefits were reviewed.  At  this point patient and her husband elect for                                  expectant management and may consider Pitocin                                  at a later time. ictated by:   Marcelle Smiling Clelia Croft, C.N.M. Attending Physician:  Michael Litter DD:  01/03/01 TD:  01/03/01 Job: [id number]

## 2010-09-28 NOTE — Discharge Summary (Signed)
Jasmine Burgess, Jasmine Burgess                  ACCOUNT NO.:  [id number]   MEDICAL RECORD NO.:  [id number]          PATIENT TYPE:  INP   LOCATION:  9310                          [facility]ILITY[facility]   PHYSICIAN:  Hal Morales, M.D.DATE OF BIRTH:  04-Feb-1966   DATE OF ADMISSION:  02/12/2005  DATE OF DISCHARGE:  02/16/2005                                 DISCHARGE SUMMARY   ADMITTING DIAGNOSES:  1.  Intrauterine pregnancy at 8 weeks.  2.  Right upper quadrant pain.  3.  Cholelithiasis.   DISHCHARGE DIAGNOSES:  1.  Intrauterine pregnancey at 8 weeks.  2.  Cholelithiasis.  3.  Biliaty colic, improved   HOSPITAL COURSE:  Jasmine Burgess is a 45 year old married white female gravida 2,  para 1-0-0-1 with last menstrual period of December 15, 2004 and estimated  gestational age of [redacted] weeks and 5 days.  She presented to maternity  admissions on February 12, 2005 with complaints of severe right upper quadrant  pain associated with nausea and vomiting.  When the patient presented she  was with intractable vomiting, nonresponsive to Phenergan.  Patient also  reported constipation for the past three days and denied diarrhea.  She was  rating the pain as a 9/10 on a pain scale.  The patient was planning to  start her OB care at [facility] on February 18, 2005.   OBSTETRICAL HISTORY:  She is a gravida 2, para 1-0-0-1.  Her first pregnancy  was a spontaneous vaginal delivery of a viable female at term without  complications.  Patient stated she felt she had preeclampsia due to swelling  and a small baby; however, she did not have elevated blood pressure with  that pregnancy.   PAST MEDICAL HISTORY:  Negative.  She has no known drug allergies.  She  reports regular monthly menses every 28-30 days.  Denies history of STDs or  abnormal Pap smears.   PAST SURGICAL HISTORY:  Negative.   FAMILY HISTORY:  Remarkable for parents with hypertension and gallbladder  disease, sister with ovarian cancer.   SOCIAL HISTORY:   Patient is married to [place] who is involved and  supportive.  Patient denies alcohol, tobacco, or illicit drug use.   Upon admission patient's vital signs were stable.  She is afebrile.  Cervix  was closed and thick.  Uterus was approximately 8 weeks' size.   ADMISSION LABORATORIES:  Amylase and lipase within normal limits.  Comprehensive metabolic panel within normal limits.  CBC:  White blood cell  count 13.6, hemoglobin 12.9, hematocrit 38, platelets 277,000.  Gallbladder  ultrasound revealed cholelithiasis, multiple stones present, but no evidence  of cholecystitis or biliary dilatation.  OB ultrasound revealed intrauterine  pregnancy consistent with 8 weeks 2 days gestation with positive fetal heart  tones and corpus luteal cyst on the right ovary.   Surgical consult was obtained for the patient which took place on February 13, 2005 by Dr. Claud Kelp.  His plan was for her to remain n.p.o. for 24  hours and to be hydrated with IV fluids.  Patient was also receiving  analgesics and antiemetics.  IV Ancef was begun at that time.  His plan was  to avoid cholecystectomy during the first trimester, at least, if not for  the whole pregnancy.  Patient received low fat diet nutritional counseling  during her hospitalization.  On February 15, 2005 which was hospital day #3  she began having frequent diarrhea but her pain was improving.  White blood  cell count was essentially normal.  At this time her vital signs remained  stable and she was afebrile.  Her diet was advanced and she was tolerating a  regular low fat diet. Antibiotics were discontinued since there was no  evidence of cholecystitis.  By hospital day #4 she was no longer having  diarrhea.  She continued to tolerate a regular low fat diet.  Biliary colic  was resolved.  Patient had an extensive conversation with Dr. Dierdre Forth regarding fetal testing and was requesting a first trimester  screening rather than  amniocentesis.  The plan was for this to be scheduled  with Dr. Particia Nearing through the [facility] office.  Patient  was doing well and was deemed to have received the full benefit of her  hospital stay.  She was discharged home.   DISCHARGE MEDICATIONS:  Multivitamin and folate 800 mcg one daily.   DISCHARGE INSTRUCTIONS:  Continue low fat diet.   DISCHARGE FOLLOW-UP:  1.  [facility] for a new OB work-up within the next 2 weeks.  2.  Follow up with Dr. Derrell Lolling from general surgery in four weeks and patient      is to call to make that appointment.  3.  Consult with Dr. Sherrie George for first trimester screen.      Cam Hai, C.N.M.      Hal Morales, M.D.  Electronically Signed    KS/MEDQ  D:  02/16/2005  T:  02/16/2005  Job:  [id number]

## 2010-09-28 NOTE — H&P (Signed)
Jasmine Burgess                  ACCOUNT NO.:  [id number]   MEDICAL RECORD NO.:  [id number]          PATIENT TYPE:  MAT   LOCATION:  MATC                          [facility]ILITY[facility]  [facility]   PHYSICIAN:  Jasmine Burgess, M.D.DATE OF BIRTH:  [birth year]   DATE OF ADMISSION:  02/12/2005  DATE OF DISCHARGE:                                HISTORY & PHYSICAL   [name] is a 45 year old gravida 2, para 1-0-0-1, who presented to [facility] at  8 weeks 5 days' gestation with complaints of severe right upper quadrant  pain associated with nausea and vomiting.  The patient reports radiation of  the pain to her back as well.  The patient reports the pain started around 2  o'clock on February 12, 2005, and has progressively worsened throughout the  evening and night.  Pain is constant with occasional sharp exacerbation.  The patient also reports she has been experiencing increased nausea and has  had several episodes of vomiting this evening as well.  The patient denies  any fever.  The patient denies any diarrhea; however, she does report some  constipation for the past three days and she did have a small bowel movement  today, however it was very hard.  The patient rates the pain as 9/10 on a  pain scale.  The patient states that she thought that she may have  gallbladder problems because of a family history of gallbladder; however,  she has never been diagnosed with gallbladder issues in the past.  The  patient denies any shortness of breath or chest pain.  The patient denies  any UTI signs and symptoms.  The patient denies any vaginal bleeding or  cramping as well.  The patient reports her only other time she had problems  with similar symptoms was during her previous pregnancy.  The patient has  not entered prenatal care as of yet and does have an appointment to start  prenatal care on February 18, 2005, at [facility]   PAST MEDICAL HISTORY:  Negative.   PAST SURGICAL HISTORY:  Negative.   MEDICATIONS:  None.   No known drug allergies.   OBSTETRIC HISTORY:  The patient is gravida 2, para 1.  The patient reports  one vaginal delivery at term with no complications; however, the patient  questions whether she may have had preeclampsia due to the baby being small  and having had swelling but normal blood pressures.   GYNECOLOGIC HISTORY:  The patient reports regular monthly menses prior to  pregnancy.  The patient states her last Pap smear was in August 2006.  The  patient denies any history of STDs or abnormal Paps.   SOCIAL HISTORY:  The patient is married to Jasmine Burgess, who is involved and  supportive.  The patient is self-employed.  The patient denies any use of  tobacco, alcohol or street drugs.   Dictation ended at this point.     ______________________________  Jasmine Burgess, CNM      Jasmine Burgess, M.D.  Electronically Signed    NOS/MEDQ  D:  02/13/2005  T:  02/13/2005  Job:  [id number]

## 2010-09-28 NOTE — H&P (Signed)
Jasmine Burgess, Jasmine Burgess                  ACCOUNT NO.:  [id number]   MEDICAL RECORD NO.:  [id number]          PATIENT TYPE:  INP   LOCATION:  9171                          [facility]   PHYSICIAN:  Janine Limbo, M.D.DATE OF BIRTH:  [birth year]   DATE OF ADMISSION:  10/03/2005  DATE OF DISCHARGE:                                HISTORY & PHYSICAL   Jasmine Burgess is a 45 year old married white female gravida 3, para 1-0-1-1 who  was seen in the office today on Sep 26, 2005 at 40-6/[redacted] weeks gestation for  her regular OB visit.  She denies leaking or bleeding.  Denies signs and  symptoms of PIH.  Her pregnancy has been followed by the [facility]  OB/GYN certified nurse midwife service has been remarkable for advanced  maternal age and with a normal first trimester screen and gallstones.  Group  B strep is negative.  Her prenatal laboratories were collected on February 27, 2005.  Hemoglobin 12.3, hematocrit 36.8, platelets 300,000.  Blood type  A+, antibody negative, RPR nonreactive, rubella immune, hepatitis B surface  antigen negative.  Pap smear within normal limits.  Gonorrhea negative,  Chlamydia negative.  One-hour Glucola from June 20, 2005 was 111 and RPR  at that time was nonreactive.  Culture of the vaginal tract for group B  Strep on August 23, 2005 was negative.   HISTORY OF PRESENT PREGNANCY:  Patient presented for care at [facility] on February 27, 2005 at 10-5/[redacted] weeks gestation.  Amniocentesis was  recommended due to advanced maternal age.  Patient did decline that and had  a first trimester screening at [facility] which was within normal  limits.  Patient was evaluated for some gallstone pain early in the second  trimester and did not have any more gallstone pain throughout the remainder  of the pregnancy.  She remained normotensive with no proteinuria, measuring  size equal to dates throughout and the rest of her prenatal care was  unremarkable.   OB  HISTORY:  She is a gravida 3, para 1-0-1-1.  In 1992 she had an EAB at [redacted]  weeks gestation.  In August 2002 she had a vaginal delivery of a female  infant weighing 5 pounds 15 ounces at 39-1/[redacted] weeks gestation.  She had an  epidural for anesthesia.  Infant's name was Durward Mallard and that pregnancy and  birth were uneventful.   MEDICAL HISTORY:  She has no medication allergies.  She experienced menarche  at the age of 73 with 28-30 day cycles.  She reports having had the usual  childhood illnesses.  She was diagnosed with cholelithiasis on February 12, 2005.  Dr. Derrell Lolling follows her for that.   SURGICAL HISTORY:  Unremarkable.  Patient reports fracturing her left wrist  in the 7th grade.   FAMILY MEDICAL HISTORY:  Remarkable for maternal grandmother with MI,  parents with hypertension, father with varicosities, sister with anemia,  patient's sister had ovarian cancer, maternal grandfather with history of  depression.   GENETIC HISTORY:  Remarkable for patient being age 23 at the  time of  delivery.  Patient's cousin with cerebral palsy and patient has a cousin  with muscular dystrophy.   SOCIAL HISTORY:  Patient is married.  Father of the baby is involved and  supportive.  His name is Richard.  Patient has 16 years of education and is  employed full-time as a Contractor.  Father of baby has 18 years of education  and is employed full-time as an Pensions consultant.  They deny any alcohol, tobacco,  or illicit drug use with the pregnancy.   OBJECTIVE DATA:  VITAL SIGNS:  Blood pressure is 118/72.  Other vital signs  are stable.  She is afebrile.  HEENT:  Grossly within normal limits.  CHEST: Clear to auscultation.  HEART: Regular rate and rhythm.  ABDOMEN:  Gravid in contour with fundal height extending approximately 39 cm  above the pubic symphysis.  Fetal heart rate reactive and reassuring  nonstress test today.  Rare uterine contractions mild in intensity.  Cervix  is 1 cm, 50% effaced, and vertex  -2.  EXTREMITIES:  Remarkable for 1+ edema.   ASSESSMENT:  1.  Intrauterine pregnancy at term.  2.  Unfavorable cervix.   PLAN:  1.  To admit to birthing suites for consult with Dr. Stefano Gaul  2.  Routine C.N.M. orders.  3.  Plan Cytotec for cervical ripening on the night of Oct 03, 2005 followed      by artificial rupture of membranes or Pitocin of the morning of May 25.      Jasmine Burgess, C.N.M.      Janine Limbo, M.D.  Electronically Signed    KS/MEDQ  D:  09/27/2005  T:  09/27/2005  Job:  [id number]

## 2010-09-28 NOTE — H&P (Signed)
NAMEKARMON, ANDIS                  ACCOUNT NO.:  [id number]   MEDICAL RECORD NO.:  [id number]          PATIENT TYPE:  MAT   LOCATION:  MATC                          [facility]   PHYSICIAN:  Hal Morales, M.D.DATE OF BIRTH:  Sep 26, 1965   DATE OF ADMISSION:  02/12/2005  DATE OF DISCHARGE:                                HISTORY & PHYSICAL   ADDENDUM:   GYNECOLOGIC HISTORY:  The patient reports regular monthly menses every 28-30  days.  The patient denies any history of STDs or abnormal Pap smears.     ______________________________  Rhona Leavens, CNM      Hal Morales, M.D.  Electronically Signed    NOS/MEDQ  D:  02/13/2005  T:  02/13/2005  Job:  [id number]

## 2011-01-07 LAB — HM MAMMOGRAPHY

## 2011-01-08 ENCOUNTER — Encounter: Payer: Self-pay | Admitting: Internal Medicine

## 2011-07-25 ENCOUNTER — Telehealth: Payer: Self-pay | Admitting: *Deleted

## 2011-07-25 ENCOUNTER — Ambulatory Visit (INDEPENDENT_AMBULATORY_CARE_PROVIDER_SITE_OTHER): Payer: BC Managed Care – PPO | Admitting: Internal Medicine

## 2011-07-25 ENCOUNTER — Encounter: Payer: Self-pay | Admitting: Internal Medicine

## 2011-07-25 VITALS — BP 138/80 | HR 78 | Temp 98.0°F | Wt 219.0 lb

## 2011-07-25 DIAGNOSIS — J309 Allergic rhinitis, unspecified: Secondary | ICD-10-CM | POA: Insufficient documentation

## 2011-07-25 DIAGNOSIS — Z0000 Encounter for general adult medical examination without abnormal findings: Secondary | ICD-10-CM

## 2011-07-25 DIAGNOSIS — J209 Acute bronchitis, unspecified: Secondary | ICD-10-CM

## 2011-07-25 MED ORDER — LORATADINE 10 MG PO TABS: 10.0000 mg | ORAL_TABLET | Freq: Every day | ORAL | Status: AC

## 2011-07-25 MED ORDER — AMOXICILLIN-POT CLAVULANATE 875-125 MG PO TABS: 1.0000 | ORAL_TABLET | Freq: Two times a day (BID) | ORAL | Status: AC

## 2011-07-25 MED ORDER — HYDROCOD POLST-CHLORPHEN POLST 10-8 MG/5ML PO LQCR: 5.0000 mL | Freq: Every evening | ORAL | Status: DC | PRN

## 2011-07-25 NOTE — Telephone Encounter (Signed)
Received staff msg pt made cpx for June need cpx labs entered... 07/25/11@2 :04pm/LMB

## 2011-07-25 NOTE — Patient Instructions (Signed)
It was good to see you today. Augmentin antibiotics and Tussionex syrup for night time cough - Your prescription(s) have been submitted to your pharmacy. Please take as directed and contact our office if you believe you are having problem(s) with the medication(s). Alternate between ibuprofen and tylenol for aches, pain and fever symptoms as discussed Hydrate, rest and use claritin, allergra or Zyrtec for allergy symptoms as needed Please schedule followup in 3-6 months for physical and labs, call sooner if problems.

## 2011-07-25 NOTE — Progress Notes (Signed)
  Subjective:    HPI  complains of cold symptoms  Onset >1 week ago, wax/wane symptoms  associated with rhinorrhea, sneezing, sore throat, mild headache and low grade fever Also myalgias, sinus pressure and mild-mod chest congestion No relief with OTC meds Precipitated by sick contacts and increase allergy symptoms in past several days  Past Medical History  Diagnosis Date  . TOBACCO USE, QUIT quit 1998    Review of Systems Constitutional: No night sweats, no unexpected weight change Pulmonary: No pleurisy or hemoptysis Cardiovascular: No chest pain or palpitations     Objective:   Physical Exam BP 138/80  Pulse 78  Temp(Src) 98 F (36.7 C) (Oral)  Wt 219 lb (99.338 kg)  SpO2 99%  LMP 07/17/2011 GEN: mildly ill appearing and audible head/chest congestion HENT: NCAT, mild sinus tenderness bilaterally, nares with clear but thick discharge, oropharynx mild erythema, no exudate Eyes: Vision grossly intact, no conjunctivitis Lungs: scattered rhonchi, no wheeze, no increased work of breathing Cardiovascular: Regular rate and rhythm, no bilateral edema  Lab Results  Component Value Date   WBC 7.6 09/26/2009   HGB 13.9 09/26/2009   HCT 40.7 09/26/2009   PLT 241.0 09/26/2009   GLUCOSE 78 09/26/2009   CHOL 196 06/23/2009   TRIG 177.0* 06/23/2009   HDL 47.00 06/23/2009   LDLCALC 114* 06/23/2009   ALT 19 09/26/2009   AST 19 09/26/2009   NA 139 09/26/2009   K 4.6 09/26/2009   CL 101 09/26/2009   CREATININE 0.8 09/26/2009   BUN 13 09/26/2009   CO2 29 09/26/2009   TSH 3.02 09/26/2009       Assessment & Plan:  Acute bronchitis Allergic rhinitis with sinusitis Cough, postnasal drip related to above    Empiric antibiotics prescribed due to symptom duration greater than 7 days Prescription cough suppression - new prescriptions done Symptomatic care with Tylenol or Advil, hydration and rest -  salt gargle advised as needed OTC antihistamine

## 2011-08-02 IMAGING — RF DG ERCP WO/W SPHINCTEROTOMY
8 series · 14 of 14 positions shown · non-contrast
Comparison: Abdominal ultrasound dated 05/23/2009

CLINICAL DATA: Gallstones.

ERCP
TECHNIQUE: Multiple spot images obtained with the fluoroscopic
device and submitted for interpretation post-procedure.  ERCP was
performed by Dr. Gadea.

[Series 1: cont. · 1 of 1 slices shown (1 of 8)]
[im 1/1]
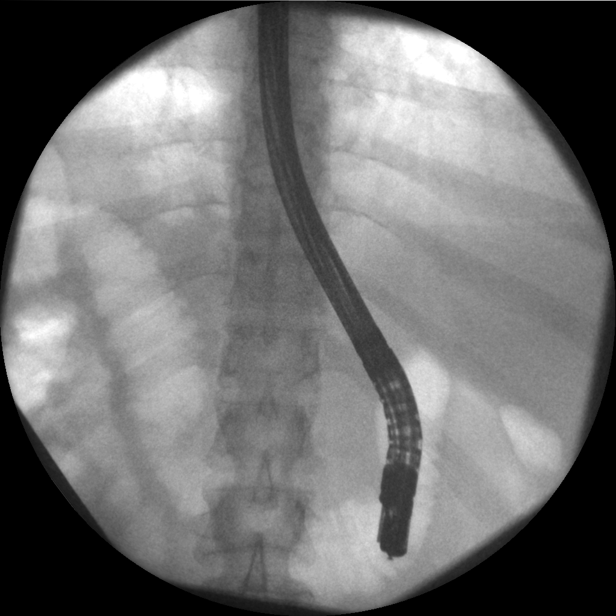

[Series 2: cont. · 1 of 1 slices shown (2 of 8)]
[im 1/1]
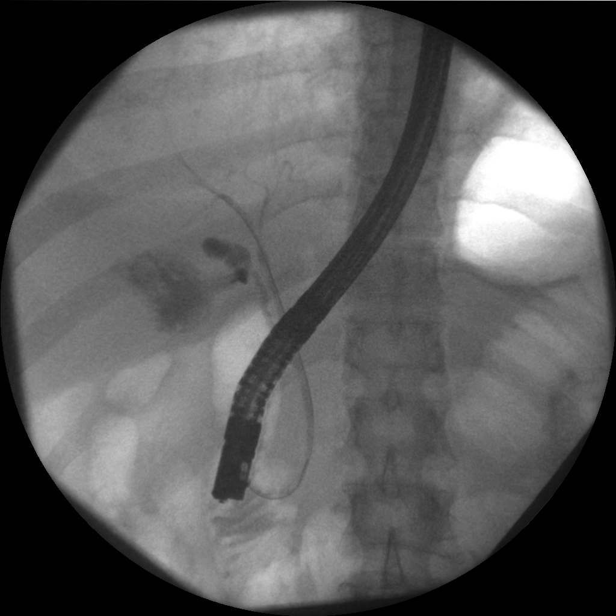

[Series 3: cont. · 5 of 5 slices shown (3 of 8)]
[im 1/5]
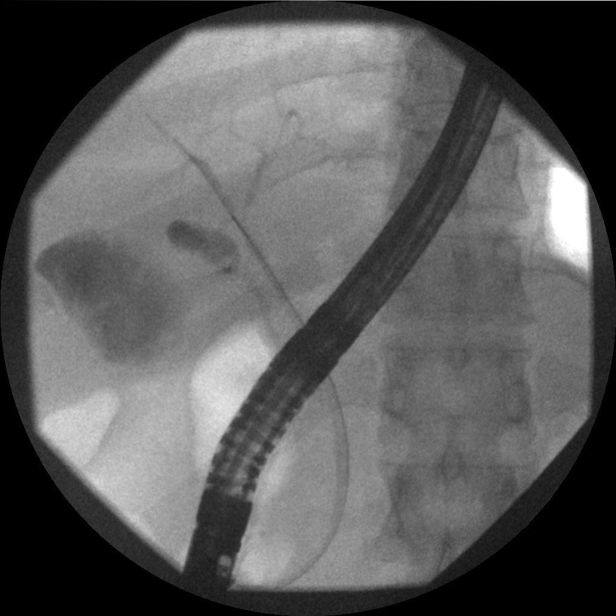
[im 2/5]
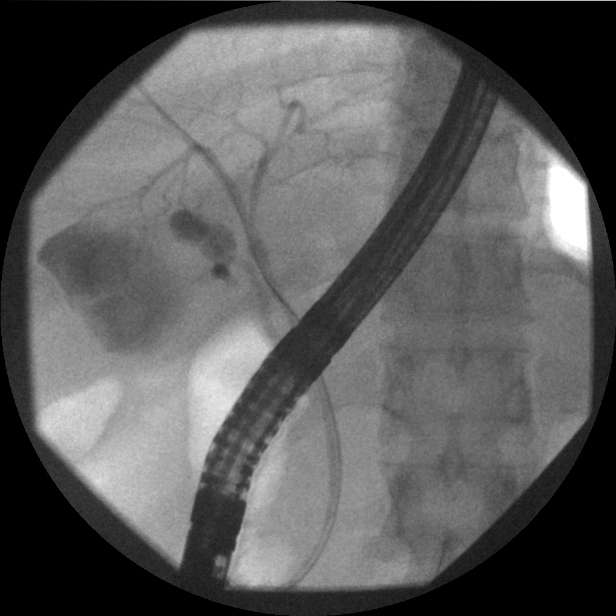
[im 3/5]
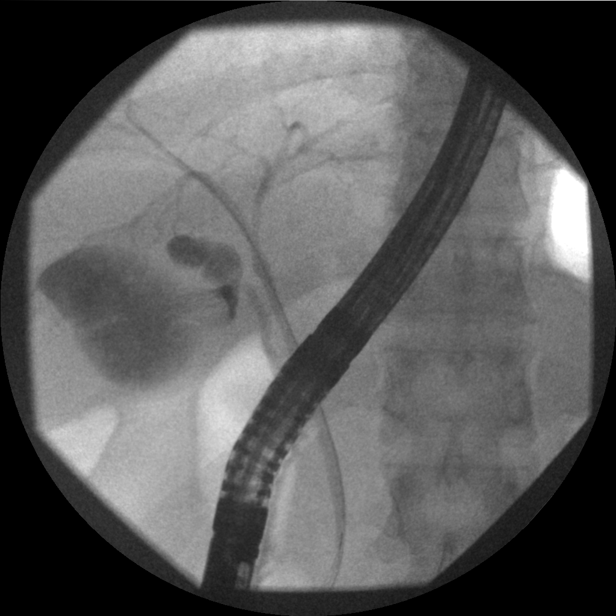
[im 4/5]
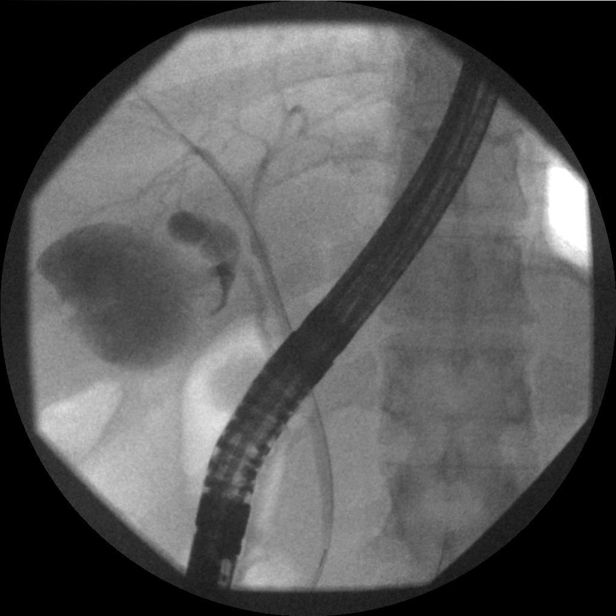
[im 5/5]
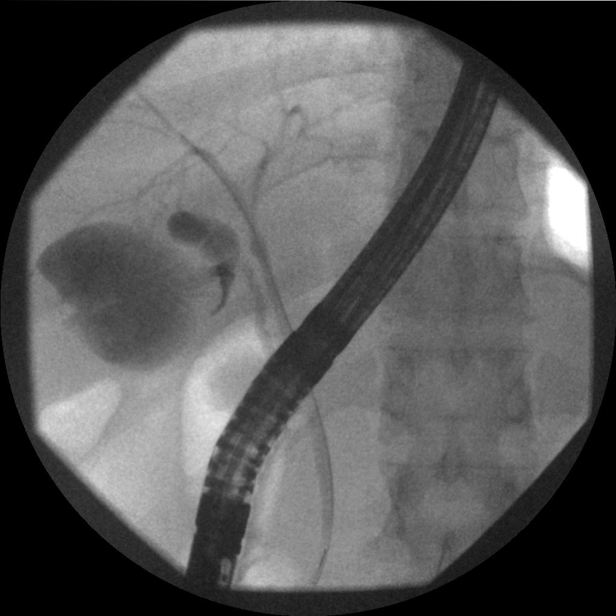

[Series 4: cont. · 1 of 1 slices shown (4 of 8)]
[im 1/1]
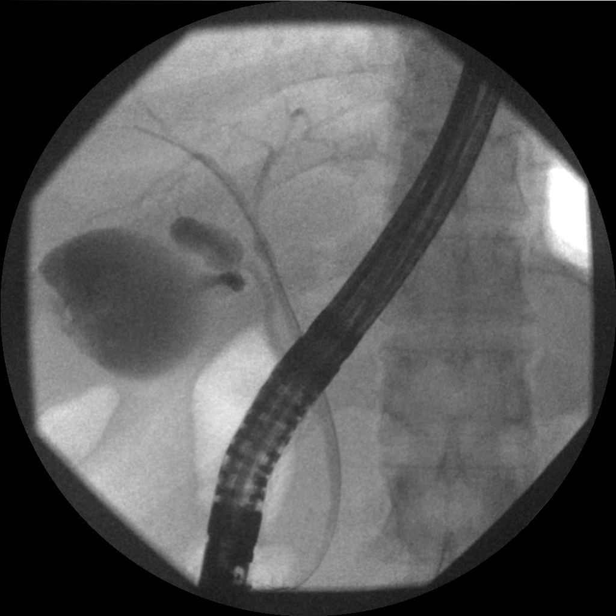

[Series 5: cont. · 1 of 1 slices shown (5 of 8)]
[im 1/1]
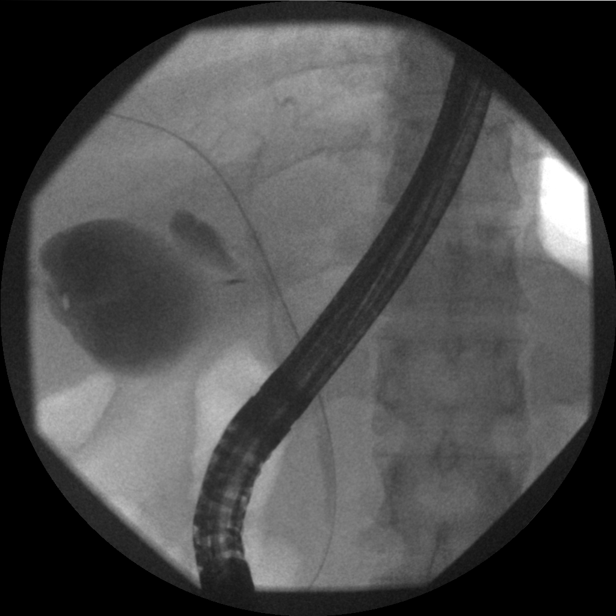

[Series 6: cont. · 2 of 2 slices shown (6 of 8)]
[im 1/2]
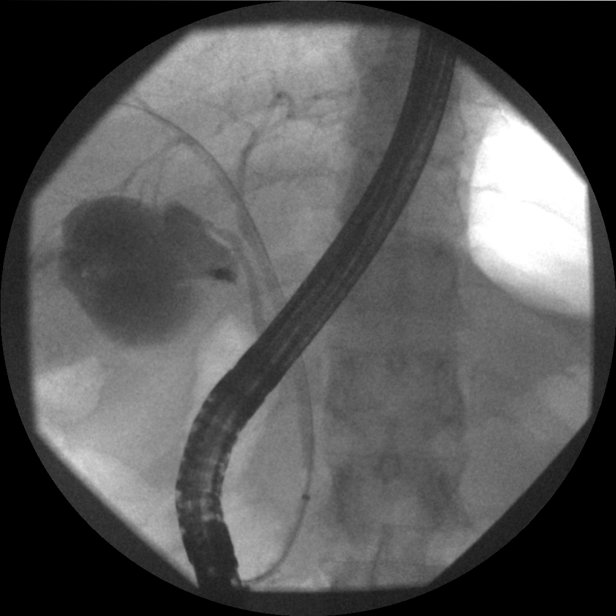
[im 2/2]
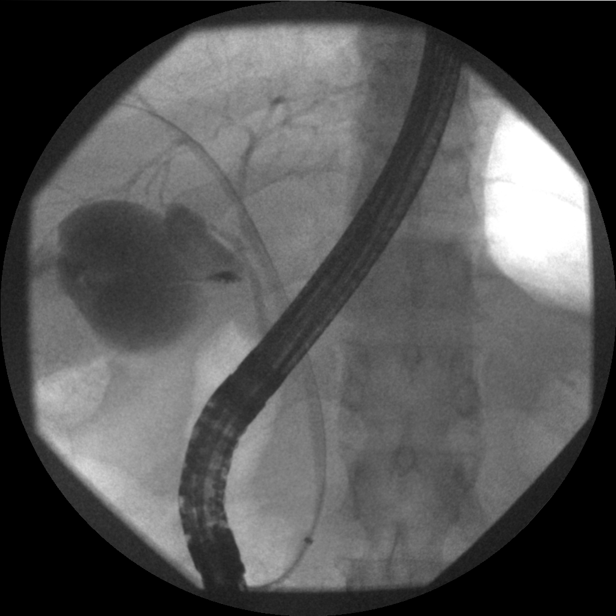

[Series 7: cont. · 1 of 1 slices shown (7 of 8)]
[im 1/1]
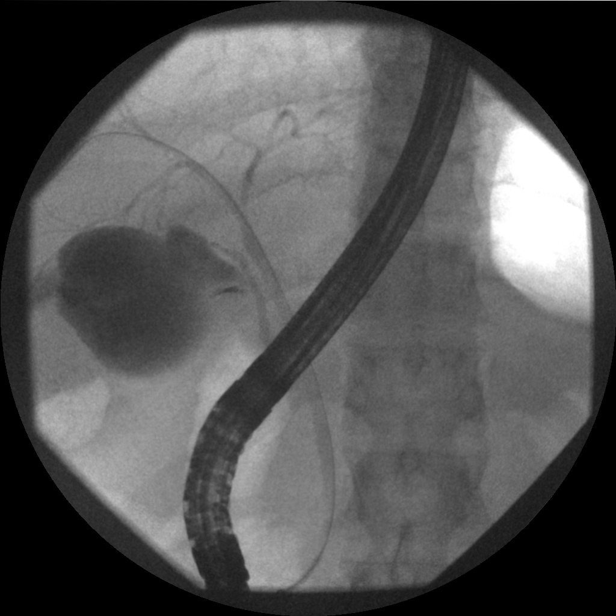

[Series 8: cont. · 2 of 2 slices shown (8 of 8)]
[im 1/2]
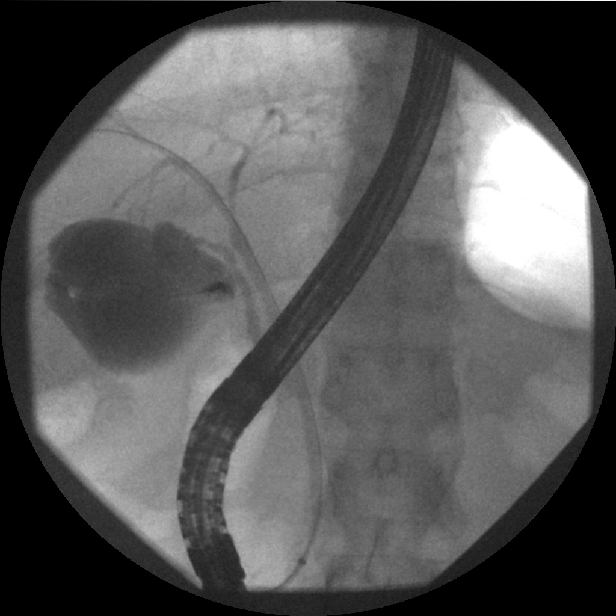
[im 2/2]
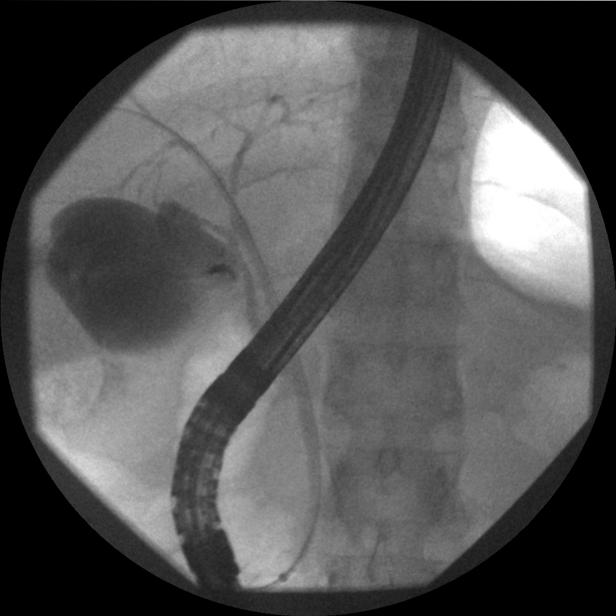

[14 of 14 positions shown; findings below may reference images not displayed]

FINDINGS: Canalization and opacification of the biliary system.  A
wire is identified in the intrahepatic ducts.  Contrast
opacification of the cystic duct and gallbladder.  The intrahepatic
and extrahepatic bile ducts are nondilated.  No large filling
defects are in the biliary system. Difficult to evaluate for
gallstones.
IMPRESSION: Nondilated biliary system without large filling defects.

Patent cystic duct.

These images were submitted for radiologic interpretation only.
Please see the procedural report for the amount of contrast and the
fluoroscopy time utilized.

## 2011-08-03 IMAGING — RF DG CHOLANGIOGRAM OPERATIVE
1 series · 4 of 4 positions shown · non-contrast
Comparison: none

CLINICAL DATA: Symptomatic gallstones.

INTRAOPERATIVE CHOLANGIOGRAM
TECHNIQUE: Multiple fluoroscopic spot radiographs were obtained
during intraoperative cholangiogram and are submitted for
interpretation post-operatively.

[Series 1: run · 4 of 55 frames shown]
[frame 2/55]
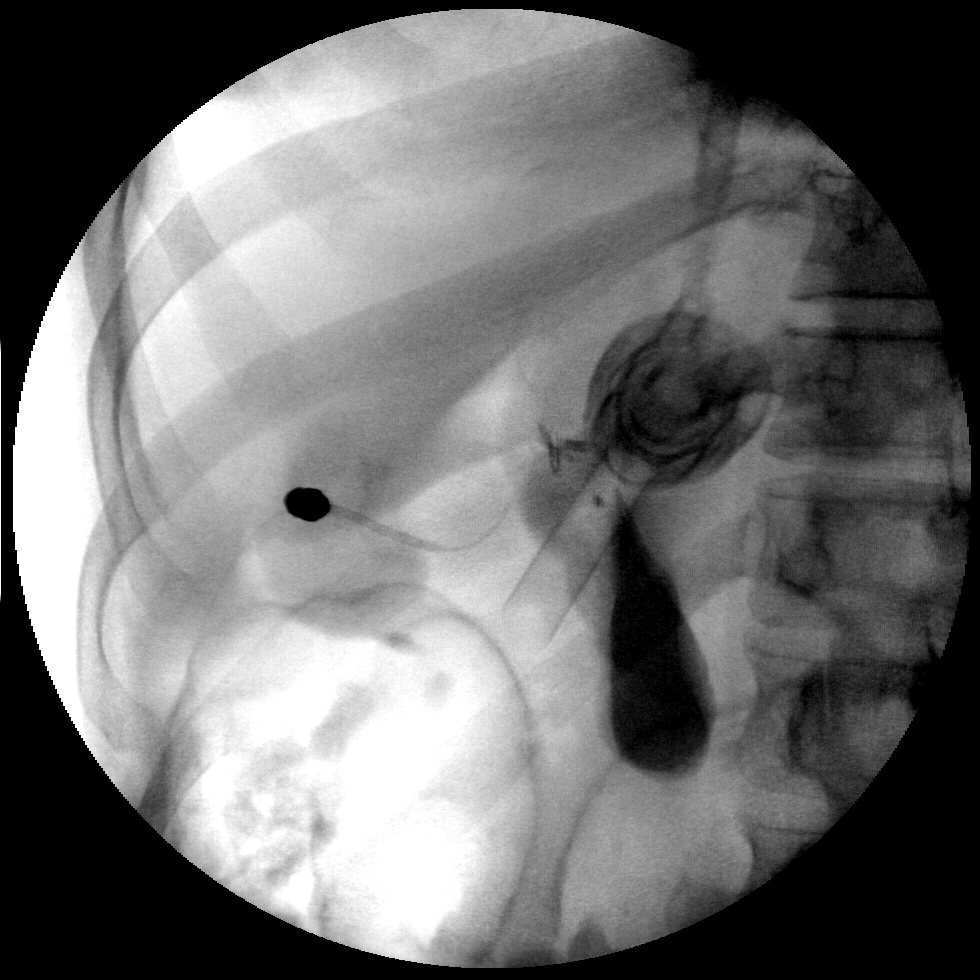
[frame 9/55]
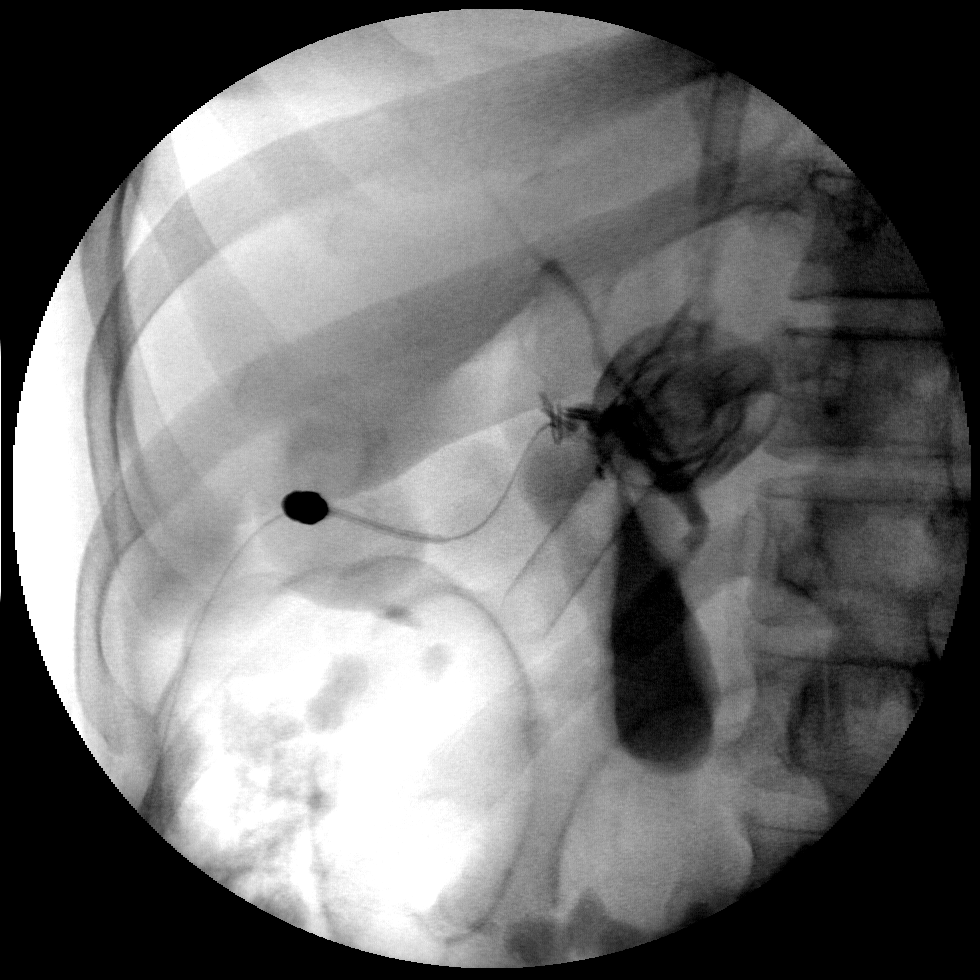
[frame 28/55]
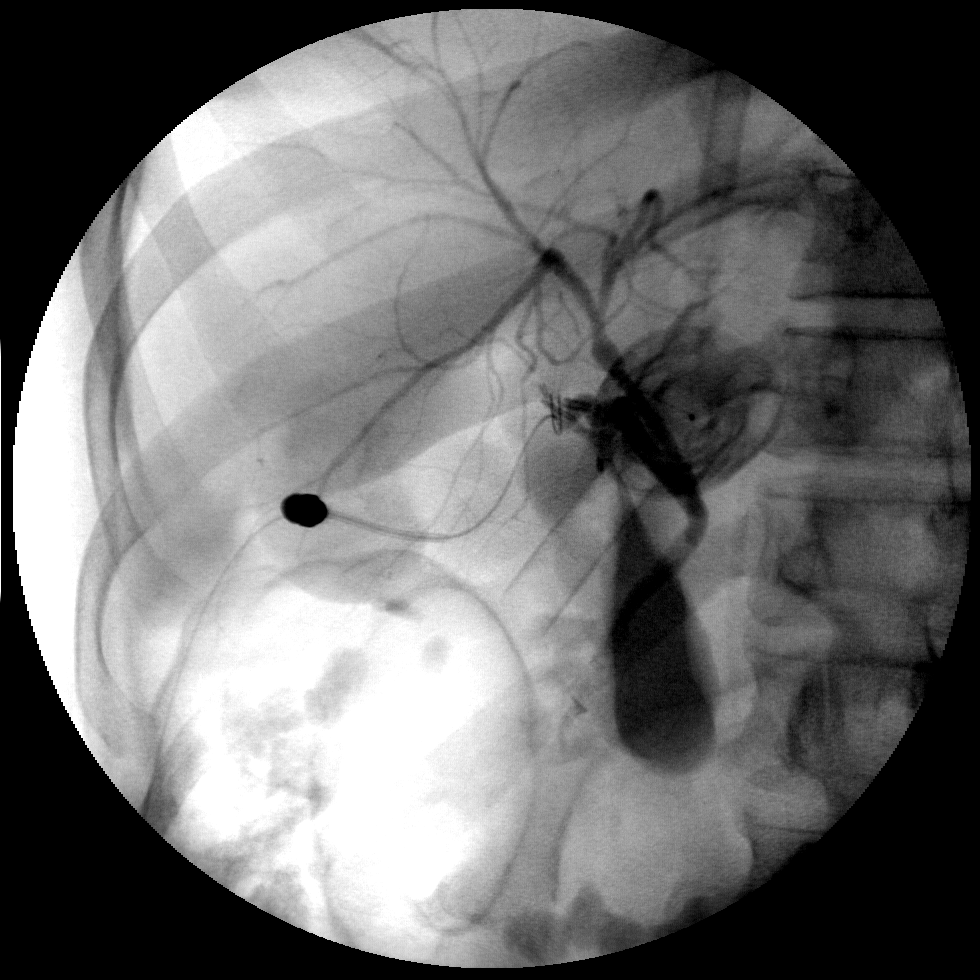
[frame 47/55]
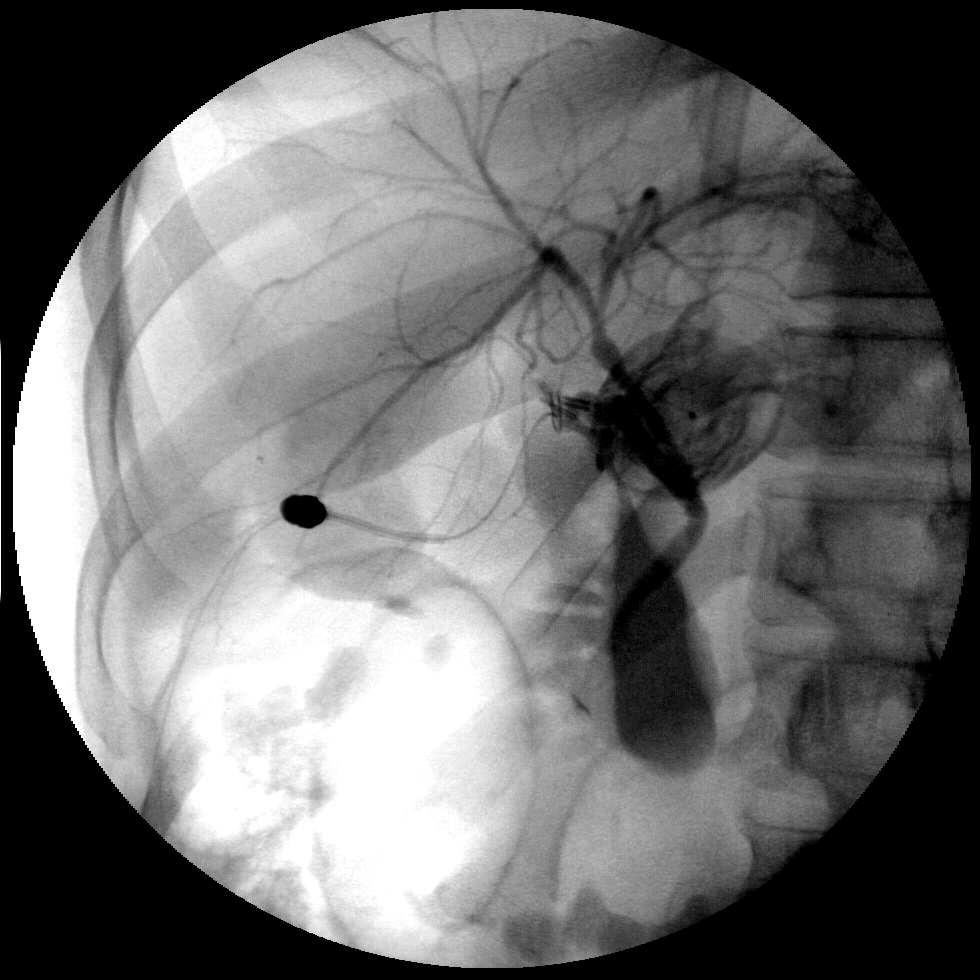

[4 of 4 positions shown; findings below may reference images not displayed]

FINDINGS: No calculi are identified within the common hepatic or
common bile ducts.  There is no evidence of common duct stricture
or obstruction.  Prompt contrast emptying into the duodenum is
seen.  Visualized intrahepatic bile ducts are unremarkable in
appearance.
IMPRESSION: Negative intraoperative cholangiogram.  No evidence of biliary
calculi or obstruction.

## 2011-10-24 ENCOUNTER — Encounter: Payer: BC Managed Care – PPO | Admitting: Internal Medicine

## 2019-04-07 ENCOUNTER — Telehealth: Payer: Self-pay | Admitting: Family Medicine

## 2019-04-07 ENCOUNTER — Ambulatory Visit: Payer: BC Managed Care – PPO | Admitting: Family Medicine

## 2019-04-07 ENCOUNTER — Encounter: Payer: Self-pay | Admitting: Family Medicine

## 2019-04-07 ENCOUNTER — Other Ambulatory Visit: Payer: Self-pay | Admitting: Family Medicine

## 2019-04-07 ENCOUNTER — Other Ambulatory Visit: Payer: Self-pay

## 2019-04-07 VITALS — BP 126/88 | HR 63 | Temp 98.1°F | Resp 17 | Ht 68.5 in | Wt 233.6 lb

## 2019-04-07 DIAGNOSIS — B9689 Other specified bacterial agents as the cause of diseases classified elsewhere: Secondary | ICD-10-CM

## 2019-04-07 DIAGNOSIS — Z1211 Encounter for screening for malignant neoplasm of colon: Secondary | ICD-10-CM

## 2019-04-07 DIAGNOSIS — Z299 Encounter for prophylactic measures, unspecified: Secondary | ICD-10-CM

## 2019-04-07 DIAGNOSIS — Z23 Encounter for immunization: Secondary | ICD-10-CM

## 2019-04-07 DIAGNOSIS — H109 Unspecified conjunctivitis: Secondary | ICD-10-CM | POA: Diagnosis not present

## 2019-04-07 DIAGNOSIS — Z1231 Encounter for screening mammogram for malignant neoplasm of breast: Secondary | ICD-10-CM | POA: Diagnosis not present

## 2019-04-07 MED ORDER — ERYTHROMYCIN 5 MG/GM OP OINT: 1.0000 "application " | TOPICAL_OINTMENT | Freq: Four times a day (QID) | OPHTHALMIC | 0 refills | Status: DC

## 2019-04-07 MED ORDER — POLYMYXIN B-TRIMETHOPRIM 10000-0.1 UNIT/ML-% OP SOLN: 1.0000 [drp] | OPHTHALMIC | 0 refills | Status: AC

## 2019-04-07 NOTE — Progress Notes (Signed)
Established Patient Office Visit  Subjective:  Patient ID: Jasmine Burgess, female    DOB: [birth year]  Age: 53 y.o. MRN: [id number]  CC:  Chief Complaint  Patient presents with  . Conjunctivitis    onset: Sunday, exposure from dad, dad given polymyxcin b tmpo eye gtts, pt using eye gtts left and it helped but is still having redness, itchiness and crusting when she awakes    HPI Jasmine Burgess presents for   Her father had conjunctivitis She states that she developed her own eye irritation She used some of her father's eye drops polyxin B to both eyes She states that she is using warm compresses but still feels like there is discharge and drainage   She had not had her mammogram or colonoscopy   Past Medical History:  Diagnosis Date  . TOBACCO USE, QUIT quit 1998    Past Surgical History:  Procedure Laterality Date  . CHOLECYSTECTOMY  05/2009    Family History  Problem Relation Age of Onset  . Diabetes Mother 67  . Hypertension Mother   . Obesity Mother   . Heart disease Father        flutter s/p ablation    Social History   Socioeconomic History  . Marital status: Married    Spouse name: Not on file  . Number of children: Not on file  . Years of education: Not on file  . Highest education level: Not on file  Occupational History  . Not on file  Social Needs  . Financial resource strain: Not on file  . Food insecurity    Worry: Not on file    Inability: Not on file  . Transportation needs    Medical: Not on file    Non-medical: Not on file  Tobacco Use  . Smoking status: Former Research scientist (life sciences)  . Smokeless tobacco: Never Used  Substance and Sexual Activity  . Alcohol use: No  . Drug use: No  . Sexual activity: Not on file  Lifestyle  . Physical activity    Days per week: Not on file    Minutes per session: Not on file  . Stress: Not on file  Relationships  . Social Herbalist on phone: Not on file    Gets together: Not on file    Attends  religious service: Not on file    Active member of club or organization: Not on file    Attends meetings of clubs or organizations: Not on file    Relationship status: Not on file  . Intimate partner violence    Fear of current or ex partner: Not on file    Emotionally abused: Not on file    Physically abused: Not on file    Forced sexual activity: Not on file  Other Topics Concern  . Not on file  Social History Narrative  . Not on file    Outpatient Medications Prior to Visit  Medication Sig Dispense Refill  . chlorpheniramine-HYDROcodone (TUSSIONEX PENNKINETIC ER) 10-8 MG/5ML LQCR Take 5 mLs by mouth at bedtime as needed. (Patient not taking: Reported on 04/07/2019) 100 mL 0   No facility-administered medications prior to visit.     No Known Allergies  ROS Review of Systems Review of Systems  Constitutional: Negative for activity change, appetite change, chills and fever.  HENT: Negative for congestion, nosebleeds, trouble swallowing and voice change.   Respiratory: Negative for cough, shortness of breath and wheezing.   Gastrointestinal: Negative for  diarrhea, nausea and vomiting.   Neurological: Negative for dizziness, speech difficulty, light-headedness and numbness.  See HPI. All other review of systems negative.     Objective:    Physical Exam  BP 126/88 (BP Location: Right Arm, Patient Position: Sitting, Cuff Size: Large)   Pulse 63   Temp 98.1 F (36.7 C) (Oral)   Resp 17   Ht 5' 8.5" (1.74 m)   Wt 233 lb 9.6 oz (106 kg)   LMP 04/04/2019   SpO2 98%   BMI 35.00 kg/m  Wt Readings from Last 3 Encounters:  04/07/19 233 lb 9.6 oz (106 kg)  07/25/11 219 lb (99.3 kg)   Physical Exam  Constitutional: Oriented to person, place, and time. Appears well-developed and well-nourished.  HENT:  Head: Normocephalic and atraumatic.  Eyes: Conjunctivae normal but there is edema of the eye lid bilaterally and EOM are normal.  Cardiovascular: Normal rate, regular  rhythm, normal heart sounds and intact distal pulses.  No murmur heard. Pulmonary/Chest: Effort normal and breath sounds normal. No stridor. No respiratory distress. Has no wheezes.  Neurological: Is alert and oriented to person, place, and time.  Skin: Skin is warm. Capillary refill takes less than 2 seconds.  Psychiatric: Has a normal mood and affect. Behavior is normal. Judgment and thought content normal.    Health Maintenance Due  Topic Date Due  . HIV Screening  05/09/1981  . PAP SMEAR-Modifier  06/23/2012  . MAMMOGRAM  05/09/2016  . COLONOSCOPY  05/09/2016  . INFLUENZA VACCINE  12/12/2018    There are no preventive care reminders to display for this patient.  Lab Results  Component Value Date   TSH 3.02 09/26/2009   Lab Results  Component Value Date   WBC 7.6 09/26/2009   HGB 13.9 09/26/2009   HCT 40.7 09/26/2009   MCV 87.7 09/26/2009   PLT 241.0 09/26/2009   Lab Results  Component Value Date   NA 139 09/26/2009   K 4.6 09/26/2009   CO2 29 09/26/2009   GLUCOSE 78 09/26/2009   BUN 13 09/26/2009   CREATININE 0.8 09/26/2009   BILITOT 0.4 09/26/2009   ALKPHOS 67 09/26/2009   AST 19 09/26/2009   ALT 19 09/26/2009   PROT 7.3 09/26/2009   ALBUMIN 4.2 09/26/2009   CALCIUM 9.3 09/26/2009   Lab Results  Component Value Date   CHOL 196 06/23/2009   Lab Results  Component Value Date   HDL 47.00 06/23/2009   Lab Results  Component Value Date   LDLCALC 114 (H) 06/23/2009   Lab Results  Component Value Date   TRIG 177.0 (H) 06/23/2009   Lab Results  Component Value Date   CHOLHDL 4 06/23/2009   No results found for: HGBA1C    Assessment & Plan:   Problem List Items Addressed This Visit    None    Visit Diagnoses    Bacterial conjunctivitis of both eyes    -  Primary Advised abx gtt Discussed home care   Relevant Medications   trimethoprim-polymyxin b (POLYTRIM) ophthalmic solution   Need for prophylactic measure       Relevant Orders   Flu  Vaccine QUAD 36+ mos IM   Screening for colon cancer       Relevant Orders   Ambulatory referral to Gastroenterology   Encounter for screening mammogram for malignant neoplasm of breast       Relevant Orders   MM Digital Screening      Meds ordered this encounter  Medications  . trimethoprim-polymyxin b (POLYTRIM) ophthalmic solution    Sig: Place 1 drop into both eyes every 4 (four) hours for 7 days.    Dispense:  10 mL    Refill:  0    Follow-up: Return for follow up for physical.    Forrest Moron, MD

## 2019-04-07 NOTE — Telephone Encounter (Signed)
Sent message to the doctor for alternative eye drop, awaiting repoonse

## 2019-04-07 NOTE — Telephone Encounter (Signed)
Pt called because the pharmacy does not have the eye drop prescription. Please advise. Pt at pharmacy now

## 2019-04-07 NOTE — Patient Instructions (Addendum)
  We recommend that you schedule a mammogram for breast cancer screening. Typically, you do not need a referral to do this. Please contact a local imaging center to schedule your mammogram.  [facility]) - [phone] or [phone]     If you have lab work done today you will be contacted with your lab results within the next 2 weeks.  If you have not heard from us then please contact us. The fastest way to get your results is to register for My Chart.   IF you received an x-ray today, you will receive an invoice from [facility]. Please contact [facility] at [phone] with questions or concerns regarding your invoice.   IF you received labwork today, you will receive an invoice from [facility]. Please contact [facility] at [phone] with questions or concerns regarding your invoice.   Our billing staff will not be able to assist you with questions regarding bills from these companies.  You will be contacted with the lab results as soon as they are available. The fastest way to get your results is to activate your My Chart account. Instructions are located on the last page of this paperwork. If you have not heard from us regarding the results in 2 weeks, please contact this office.     

## 2019-05-17 ENCOUNTER — Encounter: Payer: Self-pay | Admitting: Family Medicine

## 2019-05-27 ENCOUNTER — Ambulatory Visit: Payer: BC Managed Care – PPO | Attending: Internal Medicine

## 2019-05-27 ENCOUNTER — Ambulatory Visit: Payer: BC Managed Care – PPO

## 2019-05-27 DIAGNOSIS — Z23 Encounter for immunization: Secondary | ICD-10-CM | POA: Diagnosis not present

## 2019-05-27 NOTE — Progress Notes (Unsigned)
   Covid-19 Vaccination Clinic  Name:  Jasmine Burgess    MRN: [id number] DOB: 05-31-1965  05/27/2019  Ms. Schar was observed post Covid-19 immunization for {COVID Vaccine Observation Times:23551} without incidence. She was provided with Vaccine Information Sheet and instruction to access the V-Safe system.   Ms. Bring was instructed to call 911 with any severe reactions post vaccine: Marland Kitchen Difficulty breathing  . Swelling of your face and throat  . A fast heartbeat  . A bad rash all over your body  . Dizziness and weakness

## 2019-06-14 ENCOUNTER — Ambulatory Visit: Payer: BC Managed Care – PPO

## 2019-06-14 ENCOUNTER — Ambulatory Visit: Payer: BC Managed Care – PPO | Attending: Internal Medicine

## 2019-06-14 DIAGNOSIS — Z23 Encounter for immunization: Secondary | ICD-10-CM | POA: Insufficient documentation

## 2019-06-14 NOTE — Progress Notes (Signed)
   Covid-19 Vaccination Clinic  Name:  Jasmine Burgess    MRN: [id number] DOB: Apr 24, 1966  06/14/2019  [name] was observed post Covid-19 immunization for 15 minutes without incidence. She was provided with Vaccine Information Sheet and instruction to access the V-Safe system.   [name] was instructed to call 911 with any severe reactions post vaccine: Marland Kitchen Difficulty breathing  . Swelling of your face and throat  . A fast heartbeat  . A bad rash all over your body  . Dizziness and weakness    Immunizations Administered    Name Date Dose VIS Date Route   Pfizer COVID-19 Vaccine 06/14/2019 11:33 AM 0.3 mL 04/23/2019 Intramuscular   Manufacturer: Pine   Lot: [id number]   [name]: [id number]

## 2020-07-12 ENCOUNTER — Encounter: Payer: Self-pay | Admitting: Family Medicine

## 2020-07-12 ENCOUNTER — Ambulatory Visit: Payer: BC Managed Care – PPO | Admitting: Family Medicine

## 2020-07-12 ENCOUNTER — Other Ambulatory Visit: Payer: Self-pay

## 2020-07-12 VITALS — BP 132/94 | HR 98 | Temp 98.1°F | Ht 68.5 in | Wt 245.0 lb

## 2020-07-12 DIAGNOSIS — H6123 Impacted cerumen, bilateral: Secondary | ICD-10-CM

## 2020-07-12 DIAGNOSIS — H60393 Other infective otitis externa, bilateral: Secondary | ICD-10-CM | POA: Diagnosis not present

## 2020-07-12 MED ORDER — CIPROFLOXACIN-DEXAMETHASONE 0.3-0.1 % OT SUSP: 4.0000 [drp] | Freq: Two times a day (BID) | OTIC | 0 refills | Status: DC

## 2020-07-12 NOTE — Patient Instructions (Addendum)
Otitis Externa  Otitis externa is an infection of the outer ear canal. The outer ear canal is the area between the outside of the ear and the eardrum. Otitis externa is sometimes called swimmer's ear. What are the causes? Common causes of this condition include:  Swimming in dirty water.  Moisture in the ear.  An injury to the inside of the ear.  An object stuck in the ear.  A cut or scrape on the outside of the ear. What increases the risk? You are more likely to get this condition if you go swimming often. What are the signs or symptoms?  Itching in the ear. This is often the first symptom.  Swelling of the ear.  Redness in the ear.  Ear pain. The pain may get worse when you pull on your ear.  Pus coming from the ear. How is this treated? This condition may be treated with:  Antibiotic ear drops. These are often given for 10-14 days.  Medicines to reduce itching and swelling. Follow these instructions at home:  If you were given antibiotic ear drops, use them as told by your doctor. Do not stop using them even if your condition gets better.  Take over-the-counter and prescription medicines only as told by your doctor.  Avoid getting water in your ears as told by your doctor. You may be told to avoid swimming or water sports for a few days.  Keep all follow-up visits as told by your doctor. This is important. How is this prevented?  Keep your ears dry. Use the corner of a towel to dry your ears after you swim or bathe.  Try not to scratch or put things in your ear. Doing these things makes it easier for germs to grow in your ear.  Avoid swimming in lakes, dirty water, or pools that may not have the right amount of a chemical called chlorine. Contact a doctor if:  You have a fever.  Your ear is still red, swollen, or painful after 3 days.  You still have pus coming from your ear after 3 days.  Your redness, swelling, or pain gets worse.  You have a  really bad headache.  You have redness, swelling, pain, or tenderness behind your ear. Summary  Otitis externa is an infection of the outer ear canal.  Symptoms include pain, redness, and swelling of the ear.  If you were given antibiotic ear drops, use them as told by your doctor. Do not stop using them even if your condition gets better.  Try not to scratch or put things in your ear. This information is not intended to replace advice given to you by your health care provider. Make sure you discuss any questions you have with your health care provider. Document Revised: 10/03/2017 Document Reviewed: 10/03/2017 Elsevier Patient Education  2021 [employer] Irrigation Ear irrigation is a procedure to wash dirt and wax out of your ear canal. This procedure is also called lavage. You may need ear irrigation if you are having trouble hearing because of a buildup of earwax. You may also have ear irrigation as part of the treatment for an ear infection. Getting wax and dirt out of your ear canal can help ear drops work better. Tell a health care provider about:  Any allergies you have.  All medicines you are taking, including vitamins, herbs, eye drops, creams, and over-the-counter medicines.  Any problems you or family members have had with anesthetic medicines.  Any  blood disorders you have.  Any surgeries you have had. This includes any ear surgeries.  Any medical conditions you have.  Whether you are pregnant or may be pregnant. What are the risks? Generally, this is a safe procedure. However, problems may occur, including:  Infection.  Pain.  Hearing loss.  Fluid and debris being pushed through the eardrum and into the middle ear. This can occur if there are holes in the eardrum.  Ear irrigation failing to work. What happens before the procedure?  You will talk with your provider about the procedure and plan.  You may be given ear drops to put in your ear 15-20  minutes before irrigation. This helps loosen the wax. What happens during the procedure?  A syringe is filled with water or saline solution, which is made of salt and water.  The syringe is gently inserted into the ear canal.  The fluid is used to flush out wax and other debris. The procedure may vary among health care providers and hospitals.   What can I expect after the procedure? After an ear irrigation, follow instructions given to you by your health care provider. Follow these instructions at home: Using ear irrigation kits Ear irrigation kits are available for use at home. Ask your health care provider if this is an option for you. In general, you should:  Use a home irrigation kit only as told by your health care provider.  Read the package instructions carefully.  Follow the directions for using the syringe.  Use water that is room temperature. Do not do ear irrigation at home if you:  Have diabetes. Diabetes increases the risk of infection.  Have a hole or tear in your eardrum.  Have tubes in your ears.  Have had any ear surgery in the past.  Have been told not to irrigate your ears. Cleaning your ears  Clean the outside of your ear with a soft washcloth daily.  If told by your health care provider, use a few drops of baby oil, mineral oil, glycerin, hydrogen peroxide, or over-the-counter earwax softening drops.  Do not use cotton swabs to clean your ears. These can push wax down into the ear canal.  Do not put anything into your ears to try to remove wax. This includes ear candles.   General instructions  Take over-the-counter and prescription medicines only as told by your health care provider.  If you were prescribed an antibiotic medicine, use it as told by your health care provider. Do not stop using the antibiotic even if your condition improves.  Keep the ear clean and dry by following the instructions from your health care provider.  Keep all  follow-up visits. This is important.  Visit your health care provider at least once a year to have your ears and hearing checked. Contact a health care provider if:  Your hearing is not improving or is getting worse.  You have pain or redness in your ear.  You are dizzy.  You have ringing in your ears.  You have nausea or vomiting.  You have fluid, blood, or pus coming out of your ear. Summary  Ear irrigation is a procedure to wash dirt and wax out of your ear canal. This procedure is also called lavage.  To perform ear irrigation, ear drops may be put in your ear 15-20 minutes before irrigation. Water or saline solution will be used to flush out earwax and other debris.  You may be able to irrigate your ears  at home. Ask your health care provider if this is an option for you. Follow your health care provider's instructions.  Clean your ears with a soft cloth after irrigation. Do not use cotton swabs to clean your ears. These can push wax down into the ear canal. This information is not intended to replace advice given to you by your health care provider. Make sure you discuss any questions you have with your health care provider. Document Revised: 08/17/2019 Document Reviewed: 08/17/2019 Elsevier Patient Education  2021 [employer].   If you have lab work done today you will be contacted with your lab results within the next 2 weeks.  If you have not heard from [place] then please contact us. The fastest way to get your results is to register for My Chart.   IF you received an x-ray today, you will receive an invoice from [facility]. Please contact [facility] at [phone] with questions or concerns regarding your invoice.   IF you received labwork today, you will receive an invoice from [facility]. Please contact [facility] at [phone] with questions or concerns regarding your invoice.   Our billing staff will not be able to assist you with questions  regarding bills from these companies.  You will be contacted with the lab results as soon as they are available. The fastest way to get your results is to activate your My Chart account. Instructions are located on the last page of this paperwork. If you have not heard from [place] regarding the results in 2 weeks, please contact this office.

## 2020-07-12 NOTE — Progress Notes (Signed)
3/2/20223:52 PM  Jasmine Burgess 08-22-1965, 55 y.o., female [id number]  Chief Complaint  Patient presents with  . left ear fullness    X  1 month     HPI:   Patient is a 55 y.o. female with past medical history significant for allergies who presents today for bilateral ears clogged.  Has a history of allergies Lots of ear infections as a child Now Jasmine Burgess feels like their is drainage and ears are plugged up Left ear is worse than right Started mid January Pressure comes and goes Now building pressure for the past 3 weeks Does not use qtips Has not used anything for this    Depression screen [facility] 2/9 07/12/2020 04/07/2019  Decreased Interest 0 0  Down, Depressed, Hopeless 0 0  PHQ - 2 Score 0 0    Fall Risk  07/12/2020 04/07/2019  Falls in the past year? 0 0  Number falls in past yr: 0 0  Injury with Fall? 0 0  Follow up Falls evaluation completed -     No Known Allergies  Prior to Admission medications   Medication Sig Start Date End Date Taking? Authorizing Provider  chlorpheniramine-HYDROcodone (TUSSIONEX PENNKINETIC ER) 10-8 MG/5ML LQCR Take 5 mLs by mouth at bedtime as needed. Patient not taking: Reported on 04/07/2019 07/25/11   Rowe Clack, MD  erythromycin ophthalmic ointment Place 1 application into both eyes 4 (four) times daily. (Size 1/2 inch) 04/07/19   Forrest Moron, MD    Past Medical History:  Diagnosis Date  . TOBACCO USE, QUIT quit 1998    Past Surgical History:  Procedure Laterality Date  . CHOLECYSTECTOMY  05/2009    Social History   Tobacco Use  . Smoking status: Former Research scientist (life sciences)  . Smokeless tobacco: Never Used  Substance Use Topics  . Alcohol use: Yes    Alcohol/week: 1.0 standard drink    Types: 1 Glasses of wine per week    Comment: weekend EO    Family History  Problem Relation Age of Onset  . Diabetes Mother 74  . Hypertension Mother   . Obesity Mother   . Heart disease Father        flutter s/p ablation     Review of Systems  Constitutional: Negative for chills, fever and malaise/fatigue.  HENT: Positive for ear discharge and ear pain. Negative for congestion, sinus pain and sore throat.   Eyes: Negative for discharge and redness.  Respiratory: Negative for cough, sputum production, shortness of breath and wheezing.   Cardiovascular: Negative for chest pain and palpitations.  Skin: Negative for rash.  Neurological: Negative for dizziness, tingling and headaches.     OBJECTIVE:  Today's Vitals   07/12/20 1456  BP: (!) 132/94  Pulse: 98  Temp: 98.1 F (36.7 C)  SpO2: 98%  Weight: 245 lb (111.1 kg)  Height: 5' 8.5" (1.74 m)   Body mass index is 36.71 kg/m.   Physical Exam Constitutional:      General: She is not in acute distress.    Appearance: Normal appearance. She is not ill-appearing.  HENT:     Head: Normocephalic.     Right Ear: External ear normal. There is impacted cerumen (canal inflammed).     Left Ear: External ear normal. There is impacted cerumen (canal inflammed).  Cardiovascular:     Rate and Rhythm: Normal rate and regular rhythm.     Pulses: Normal pulses.     Heart sounds: Normal heart sounds. No murmur heard.  No friction rub. No gallop.   Pulmonary:     Effort: Pulmonary effort is normal. No respiratory distress.     Breath sounds: Normal breath sounds. No stridor. No wheezing, rhonchi or rales.  Abdominal:     General: Bowel sounds are normal.     Palpations: Abdomen is soft.     Tenderness: There is no abdominal tenderness.  Musculoskeletal:     Right lower leg: No edema.     Left lower leg: No edema.  Skin:    General: Skin is warm and dry.  Neurological:     Mental Status: She is alert and oriented to person, place, and time.  Psychiatric:        Mood and Affect: Mood normal.        Behavior: Behavior normal.    Post Irrigation bilateral TM intact, Canal erythema noted No results found for this or any previous visit (from the past 24  hour(s)).  No results found.   ASSESSMENT and PLAN  Problem List Items Addressed This Visit   None   Visit Diagnoses    Bilateral impacted cerumen    -  Primary   Relevant Orders   Ear Lavage   Infective otitis externa of both ears       Relevant Medications   ciprofloxacin-dexamethasone (CIPRODEX) OTIC suspension      Plan . Ciprodex bid for 7 days . RTC/ ED precautions provided   Return if symptoms worsen or fail to improve.    Huston Foley Majestic Molony, FNP-BC [facility] [place], [place] [address] Ph.  [phone] Fax [phone]

## 2020-07-13 ENCOUNTER — Telehealth: Payer: Self-pay | Admitting: Emergency Medicine

## 2020-07-13 ENCOUNTER — Other Ambulatory Visit: Payer: Self-pay | Admitting: *Deleted

## 2020-07-13 DIAGNOSIS — H60393 Other infective otitis externa, bilateral: Secondary | ICD-10-CM

## 2020-07-13 MED ORDER — CIPROFLOXACIN-DEXAMETHASONE 0.3-0.1 % OT SUSP: 4.0000 [drp] | Freq: Two times a day (BID) | OTIC | 0 refills | Status: AC

## 2020-07-13 NOTE — Telephone Encounter (Signed)
Prescription sent

## 2020-07-13 NOTE — Telephone Encounter (Signed)
What is the name of the medication?Jasmine (CIPRODEX) OTIC suspension [[id number]]     Have you contacted your pharmacy to request a refill? Yes, this is the script we sent yesterday to [facility] at [place]. This pharmacy is having issues and can not fill this script. Pt said they also could not send the script to another facility. She would like her script sent to [facility] [place].   Which pharmacy would you like this sent to? [facility] at [address].    Patient notified that their request is being sent to the clinical staff for review and that they should receive a call once it is complete. If they do not receive a call within 72 hours they can check with their pharmacy or our office.

## 2022-02-25 ENCOUNTER — Encounter: Payer: Self-pay | Admitting: Internal Medicine

## 2022-03-12 ENCOUNTER — Telehealth: Payer: Self-pay | Admitting: *Deleted

## 2022-03-12 NOTE — Telephone Encounter (Signed)
Unable to reach patient, no show for pre visit scheduled @ 3 pm.  Requested patient return call and reschedule pre visit.

## 2022-03-19 ENCOUNTER — Ambulatory Visit (AMBULATORY_SURGERY_CENTER): Payer: Self-pay

## 2022-03-19 VITALS — Ht 68.0 in | Wt 235.0 lb

## 2022-03-19 DIAGNOSIS — Z1211 Encounter for screening for malignant neoplasm of colon: Secondary | ICD-10-CM

## 2022-03-19 MED ORDER — NA SULFATE-K SULFATE-MG SULF 17.5-3.13-1.6 GM/177ML PO SOLN: 1.0000 | Freq: Once | ORAL | 0 refills | Status: AC

## 2022-03-19 NOTE — Progress Notes (Signed)

## 2022-04-03 ENCOUNTER — Encounter: Payer: Self-pay | Admitting: Internal Medicine

## 2022-04-11 ENCOUNTER — Ambulatory Visit (AMBULATORY_SURGERY_CENTER): Payer: BC Managed Care – PPO | Admitting: Internal Medicine

## 2022-04-11 ENCOUNTER — Encounter: Payer: Self-pay | Admitting: Internal Medicine

## 2022-04-11 ENCOUNTER — Encounter: Payer: BC Managed Care – PPO | Admitting: Internal Medicine

## 2022-04-11 VITALS — BP 135/77 | HR 61 | Temp 98.0°F | Resp 12 | Ht 68.0 in | Wt 235.0 lb

## 2022-04-11 DIAGNOSIS — D125 Benign neoplasm of sigmoid colon: Secondary | ICD-10-CM

## 2022-04-11 DIAGNOSIS — Z1211 Encounter for screening for malignant neoplasm of colon: Secondary | ICD-10-CM | POA: Diagnosis present

## 2022-04-11 DIAGNOSIS — D128 Benign neoplasm of rectum: Secondary | ICD-10-CM | POA: Diagnosis not present

## 2022-04-11 DIAGNOSIS — D123 Benign neoplasm of transverse colon: Secondary | ICD-10-CM | POA: Diagnosis not present

## 2022-04-11 DIAGNOSIS — D120 Benign neoplasm of cecum: Secondary | ICD-10-CM | POA: Diagnosis not present

## 2022-04-11 MED ORDER — SODIUM CHLORIDE 0.9 % IV SOLN: 500.0000 mL | Freq: Once | INTRAVENOUS | Status: DC

## 2022-04-11 NOTE — Progress Notes (Signed)
VS by DT  Pt's states no medical or surgical changes since previsit or office visit.  

## 2022-04-11 NOTE — Progress Notes (Signed)
Pt resting comfortably. VSS. Airway intact. SBAR complete to RN. All questions answered.   

## 2022-04-11 NOTE — Progress Notes (Signed)
[facility] History and Physical   Primary Care Physician:  Hoyt Koch, MD   Reason for Procedure:   CRCA screening  Plan:    colonoscopy     HPI: Jasmine Burgess is a 56 y.o. female here for screening exam   Past Medical History:  Diagnosis Date   TOBACCO USE, QUIT quit 1998    Past Surgical History:  Procedure Laterality Date   CHOLECYSTECTOMY  05/2009    Prior to Admission medications   Medication Sig Start Date End Date Taking? Authorizing Provider  Multiple Vitamin (MULTIVITAMIN) tablet Take 1 tablet by mouth daily.   Yes [provider]    Current Outpatient Medications  Medication Sig Dispense Refill   Multiple Vitamin (MULTIVITAMIN) tablet Take 1 tablet by mouth daily.     Current Facility-Administered Medications  Medication Dose Route Frequency Provider Last Rate Last Admin   0.9 %  sodium chloride infusion  500 mL Intravenous Once Gatha Mayer, MD        Allergies as of 04/11/2022 - Review Complete 04/11/2022  Allergen Reaction Noted   Cat hair extract  03/19/2022    Family History  Problem Relation Age of Onset   Diabetes Mother 65   Hypertension Mother    Obesity Mother    Colon polyps Father    Heart disease Father        flutter s/p ablation   Colon polyps Sister    Colon cancer Neg Hx    Esophageal cancer Neg Hx    Rectal cancer Neg Hx    Stomach cancer Neg Hx     Social History   Socioeconomic History   Marital status: Married    Spouse name: Not on file   Number of children: Not on file   Years of education: Not on file   Highest education level: Not on file  Occupational History   Not on file  Tobacco Use   Smoking status: Former   Smokeless tobacco: Never  Vaping Use   Vaping Use: Never used  Substance and Sexual Activity   Alcohol use: Yes    Alcohol/week: 1.0 standard drink of alcohol    Types: 1 Glasses of wine per week    Comment: weekend EO   Drug use: No   Sexual activity: Not on  file  Other Topics Concern   Not on file  Social History Narrative   Not on file   Social Determinants of Health   Financial Resource Strain: Not on file  Food Insecurity: Not on file  Transportation Needs: Not on file  Physical Activity: Not on file  Stress: Not on file  Social Connections: Not on file  Intimate Partner Violence: Not on file    Review of Systems:  All other review of systems negative except as mentioned in the HPI.  Physical Exam: Vital signs BP 115/75   Pulse 72   Temp 98 F (36.7 C)   Ht '5\' 8"'$  (1.727 m)   Wt 235 lb (106.6 kg)   SpO2 100%   BMI 35.73 kg/m   General:   Alert,  Well-developed, well-nourished, pleasant and cooperative in NAD Lungs:  Clear throughout to auscultation.   Heart:  Regular rate and rhythm; no murmurs, clicks, rubs,  or gallops. Abdomen:  Soft, nontender and nondistended. Normal bowel sounds.   Neuro/Psych:  Alert and cooperative. Normal mood and affect. A and O x 3   '@Truth Barot'$  Simonne Maffucci, MD, [facility] [phone] (pager) 04/11/2022  3:25 PM@

## 2022-04-11 NOTE — Progress Notes (Signed)
Called to room to assist during endoscopic procedure.  Patient ID and intended procedure confirmed with present staff. Received instructions for my participation in the procedure from the performing physician.  

## 2022-04-11 NOTE — Op Note (Signed)
[facility] Patient Name: Jasmine Burgess Procedure Date: 04/11/2022 3:20 PM MRN: [id number] Endoscopist: Gatha Mayer , MD, [id number] Age: 56 Referring MD:  Date of Birth: Feb 12, 1966 Gender: Female Account #: [id number] Procedure:                Colonoscopy Indications:              Screening for colorectal malignant neoplasm, This                            is the patient's first colonoscopy Medicines:                Monitored Anesthesia Care Procedure:                Pre-Anesthesia Assessment:                           - Prior to the procedure, a History and Physical                            was performed, and patient medications and                            allergies were reviewed. The patient's tolerance of                            previous anesthesia was also reviewed. The risks                            and benefits of the procedure and the sedation                            options and risks were discussed with the patient.                            All questions were answered, and informed consent                            was obtained. Prior Anticoagulants: The patient has                            taken no anticoagulant or antiplatelet agents. ASA                            Grade Assessment: II - A patient with mild systemic                            disease. After reviewing the risks and benefits,                            the patient was deemed in satisfactory condition to                            undergo the procedure.  After obtaining informed consent, the colonoscope                            was passed under direct vision. Throughout the                            procedure, the patient's blood pressure, pulse, and                            oxygen saturations were monitored continuously. The                            Olympus PCF-H190DL (#[id number]) Colonoscope was                            introduced through the anus  and advanced to the the                            cecum, identified by appendiceal orifice and                            ileocecal valve. The colonoscopy was performed                            without difficulty. The patient tolerated the                            procedure well. The quality of the bowel                            preparation was good. The ileocecal valve,                            appendiceal orifice, and rectum were photographed.                            The bowel preparation used was SUPREP via split                            dose instruction. Scope In: 3:36:02 PM Scope Out: 3:55:39 PM Scope Withdrawal Time: 0 hours 11 minutes 51 seconds  Total Procedure Duration: 0 hours 19 minutes 37 seconds  Findings:                 The perianal and digital rectal examinations were                            normal.                           Five sessile polyps were found in the rectum,                            sigmoid colon, transverse colon and cecum. The  polyps were 1 to 8 mm in size. These polyps were                            removed with a cold snare. Resection and retrieval                            were complete. Verification of patient                            identification for the specimen was done. Estimated                            blood loss was minimal.                           The exam was otherwise without abnormality on                            direct and retroflexion views. Complications:            No immediate complications. Estimated Blood Loss:     Estimated blood loss was minimal. Impression:               - Five 1 to 8 mm polyps in the rectum, in the                            sigmoid colon, in the transverse colon and in the                            cecum, removed with a cold snare. Resected and                            retrieved.                           - The examination was otherwise normal on direct                             and retroflexion views. Recommendation:           - Patient has a contact number available for                            emergencies. The signs and symptoms of potential                            delayed complications were discussed with the                            patient. Return to normal activities tomorrow.                            Written discharge instructions were provided to the  patient.                           - Resume previous diet.                           - Continue present medications.                           - Repeat colonoscopy is recommended. The                            colonoscopy date will be determined after pathology                            results from today's exam become available for                            review. Gatha Mayer, MD 04/11/2022 4:06:51 PM This report has been signed electronically.

## 2022-04-11 NOTE — Patient Instructions (Addendum)
I found and removed 5 polyps. All look benign.  I will let you know pathology results and when to have another routine colonoscopy by mail and/or My Chart.  Please resume your normal diet and medications.  I appreciate the opportunity to care for you. Gatha Mayer, MD, [facility]  Handout on polyps given.   YOU HAD AN ENDOSCOPIC PROCEDURE TODAY AT [facility]:   Refer to the procedure report that was given to you for any specific questions about what was found during the examination.  If the procedure report does not answer your questions, please call your gastroenterologist to clarify.  If you requested that your care partner not be given the details of your procedure findings, then the procedure report has been included in a sealed envelope for you to review at your convenience later.  YOU SHOULD EXPECT: Some feelings of bloating in the abdomen. Passage of more gas than usual.  Walking can help get rid of the air that was put into your GI tract during the procedure and reduce the bloating. If you had a lower endoscopy (such as a colonoscopy or flexible sigmoidoscopy) you may notice spotting of blood in your stool or on the toilet paper. If you underwent a bowel prep for your procedure, you may not have a normal bowel movement for a few days.  Please Note:  You might notice some irritation and congestion in your nose or some drainage.  This is from the oxygen used during your procedure.  There is no need for concern and it should clear up in a day or so.  SYMPTOMS TO REPORT IMMEDIATELY:  Following lower endoscopy (colonoscopy or flexible sigmoidoscopy):  Excessive amounts of blood in the stool  Significant tenderness or worsening of abdominal pains  Swelling of the abdomen that is new, acute  Fever of 100F or higher   For urgent or emergent issues, a gastroenterologist can be reached at any hour by calling [phone]. Do not use MyChart messaging for urgent concerns.     DIET:  We do recommend a small meal at first, but then you may proceed to your regular diet.  Drink plenty of fluids but you should avoid alcoholic beverages for 24 hours.  ACTIVITY:  You should plan to take it easy for the rest of today and you should NOT DRIVE or use heavy machinery until tomorrow (because of the sedation medicines used during the test).    FOLLOW UP: Our staff will call the number listed on your records the next business day following your procedure.  We will call around 7:15- 8:00 am to check on you and address any questions or concerns that you may have regarding the information given to you following your procedure. If we do not reach you, we will leave a message.     If any biopsies were taken you will be contacted by phone or by letter within the next 1-3 weeks.  Please call us at [phone] if you have not heard about the biopsies in 3 weeks.    SIGNATURES/CONFIDENTIALITY: You and/or your care partner have signed paperwork which will be entered into your electronic medical record.  These signatures attest to the fact that that the information above on your After Visit Summary has been reviewed and is understood.  Full responsibility of the confidentiality of this discharge information lies with you and/or your care-partner.

## 2022-04-12 ENCOUNTER — Telehealth: Payer: Self-pay | Admitting: *Deleted

## 2022-04-12 NOTE — Telephone Encounter (Signed)
  Follow up Call-     04/11/2022    3:06 PM  Call back number  Post procedure Call Back phone  # [phone]  Permission to leave phone message Yes     Patient questions:  Do you have a fever, pain , or abdominal swelling? No. Pain Score  0 *  Have you tolerated food without any problems? Yes.    Have you been able to return to your normal activities? Yes.    Do you have any questions about your discharge instructions: Diet   No. Medications  No. Follow up visit  No.  Do you have questions or concerns about your Care? No.  Actions: * If pain score is 4 or above: No action needed, pain <4.

## 2022-04-21 ENCOUNTER — Encounter: Payer: Self-pay | Admitting: Internal Medicine

## 2022-10-31 ENCOUNTER — Other Ambulatory Visit (HOSPITAL_COMMUNITY)
Admission: RE | Admit: 2022-10-31 | Discharge: 2022-10-31 | Disposition: A | Discharge status: Home / Self Care | Payer: BC Managed Care – PPO | Source: Ambulatory Visit | Attending: Nurse Practitioner | Admitting: Nurse Practitioner

## 2022-10-31 ENCOUNTER — Encounter: Payer: Self-pay | Admitting: Nurse Practitioner

## 2022-10-31 ENCOUNTER — Ambulatory Visit (INDEPENDENT_AMBULATORY_CARE_PROVIDER_SITE_OTHER): Payer: BC Managed Care – PPO | Admitting: Nurse Practitioner

## 2022-10-31 VITALS — BP 124/84 | HR 80 | Ht 68.5 in | Wt 243.0 lb

## 2022-10-31 DIAGNOSIS — Z124 Encounter for screening for malignant neoplasm of cervix: Secondary | ICD-10-CM | POA: Insufficient documentation

## 2022-10-31 DIAGNOSIS — N951 Menopausal and female climacteric states: Secondary | ICD-10-CM | POA: Diagnosis not present

## 2022-10-31 DIAGNOSIS — Z01419 Encounter for gynecological examination (general) (routine) without abnormal findings: Secondary | ICD-10-CM

## 2022-10-31 NOTE — Progress Notes (Signed)
   Jasmine Burgess [birth year] [id number]   History:  57 y.o. G2P2002 presents as new patient to establish care. Perimenopausal. Micah Flesher about 6 months without a cycle. Menses monthly since February. Denies menopausal symptoms. Normal pap history.   Gynecologic History Patient's last menstrual period was 05/13/2022 (approximate). Period Duration (Days): 4-5 Period Pattern: (!) Irregular Menstrual Flow: Heavy Contraception/Family planning: vasectomy Sexually active: Yes  Health Maintenance Last Pap: 06/23/2009. Results were: Normal Last mammogram: 12/28/2010. Results were: Normal Last colonoscopy: 04/11/2022. Results were: Adenoma, SSPs. 5-year recall Last Dexa: Not indicated  Past medical history, past surgical history, family history and social history were all reviewed and documented in the EPIC chart. 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 57 yo daughter, working and going to school.   ROS:  A ROS was performed and pertinent positives and negatives are included.  Exam:  Vitals:   10/31/22 1407  BP: 124/84  Pulse: 80  SpO2: 98%  Weight: 243 lb (110.2 kg)  Height: 5' 8.5" (1.74 m)   Body mass index is 36.41 kg/m.  General appearance:  Normal Thyroid:  Symmetrical, normal in size, without palpable masses or nodularity. Respiratory  Auscultation:  Clear without wheezing or rhonchi Cardiovascular  Auscultation:  Regular rate, without rubs, murmurs or gallops  Edema/varicosities:  Not grossly evident Abdominal  Soft,nontender, without masses, guarding or rebound.  Liver/spleen:  No organomegaly noted  Hernia:  None appreciated  Skin  Inspection:  Grossly normal Breasts: Examined lying and sitting.   Right: Without masses, retractions, nipple discharge or axillary adenopathy.   Left: Without masses, retractions, nipple discharge or axillary adenopathy. Genitourinary   Inguinal/mons:  Normal without inguinal adenopathy  External genitalia:  Normal appearing vulva  with no masses, tenderness, or lesions  BUS/Urethra/Skene's glands:  Normal  Vagina:  Normal appearing with normal color and discharge, no lesions  Cervix:  Normal appearing without discharge or lesions  Uterus:  Difficult to palpate due to body habitus but no gross masses or tenderness  Adnexa/parametria:     Rt: Normal in size, without masses or tenderness.   Lt: Normal in size, without masses or tenderness.  Anus and perineum: Normal  Digital rectal exam: Deferred  Patient informed chaperone available to be present for breast and pelvic exam. Patient has requested no chaperone to be present. Patient has been advised what will be completed during breast and pelvic exam.   Assessment/Plan:  57 y.o. G2P2002 to establish care.   Well female exam with routine gynecological exam - Education provided on SBEs, importance of preventative screenings, current guidelines, high calcium diet, regular exercise, and multivitamin daily.  Labs with PCP.   Screening for cervical cancer - Plan: Cytology - PAP( [facility]). Normal pap history.   Perimenopausal - irregular menses. Denies menopausal symptoms. Discussed what to expect.   Screening for breast cancer - Normal mammogram history. Scheduled for mammogram this afternoon. Normal breast exam today.  Screening for colon cancer - 03/2022 colonoscopy. Will repeat at 5-year interval per GI's recommendation.   Screening for osteoporosis - Average risk. Will plan DXA at age 57.   Return in 1 year for annual.     Olivia Mackie DNP, 2:40 PM 10/31/2022

## 2022-11-04 LAB — CYTOLOGY - PAP
Comment: NEGATIVE
Diagnosis: NEGATIVE
High risk HPV: NEGATIVE

## 2022-11-06 ENCOUNTER — Encounter: Payer: Self-pay | Admitting: Nurse Practitioner

## 2022-11-11 ENCOUNTER — Encounter: Payer: Self-pay | Admitting: Nurse Practitioner

## 2022-11-13 ENCOUNTER — Encounter: Payer: Self-pay | Admitting: Nurse Practitioner
